# Patient Record
Sex: Female | Born: 1955 | Race: White | Hispanic: No | Smoking: Never smoker
Health system: Southern US, Community
[De-identification: ages and names within clinical notes are randomized; demographics above are authoritative.]

## PROBLEM LIST (undated history)

## (undated) DIAGNOSIS — G510 Bell's palsy: Secondary | ICD-10-CM

## (undated) DIAGNOSIS — G43909 Migraine, unspecified, not intractable, without status migrainosus: Secondary | ICD-10-CM

## (undated) DIAGNOSIS — M25552 Pain in left hip: Secondary | ICD-10-CM

## (undated) DIAGNOSIS — E039 Hypothyroidism, unspecified: Secondary | ICD-10-CM

## (undated) DIAGNOSIS — I10 Essential (primary) hypertension: Secondary | ICD-10-CM

## (undated) DIAGNOSIS — K760 Fatty (change of) liver, not elsewhere classified: Secondary | ICD-10-CM

## (undated) DIAGNOSIS — E785 Hyperlipidemia, unspecified: Secondary | ICD-10-CM

## (undated) DIAGNOSIS — H409 Unspecified glaucoma: Secondary | ICD-10-CM

## (undated) DIAGNOSIS — M545 Low back pain, unspecified: Secondary | ICD-10-CM

## (undated) DIAGNOSIS — E559 Vitamin D deficiency, unspecified: Secondary | ICD-10-CM

## (undated) HISTORY — DX: Unspecified glaucoma: H40.9

## (undated) HISTORY — DX: Pain in left hip: M25.552

## (undated) HISTORY — DX: Bell's palsy: G51.0

## (undated) HISTORY — DX: Hyperlipidemia, unspecified: E78.5

## (undated) HISTORY — DX: Vitamin D deficiency, unspecified: E55.9

## (undated) HISTORY — DX: Low back pain, unspecified: M54.50

## (undated) HISTORY — DX: Hypothyroidism, unspecified: E03.9

## (undated) HISTORY — DX: Low back pain: M54.5

## (undated) HISTORY — DX: Fatty (change of) liver, not elsewhere classified: K76.0

## (undated) HISTORY — DX: Essential (primary) hypertension: I10

## (undated) HISTORY — DX: Migraine, unspecified, not intractable, without status migrainosus: G43.909

---

## 2002-01-20 ENCOUNTER — Other Ambulatory Visit: Admission: RE | Admit: 2002-01-20 | Discharge: 2002-01-20 | Payer: Self-pay | Admitting: Obstetrics and Gynecology

## 2004-11-06 ENCOUNTER — Encounter: Admission: RE | Admit: 2004-11-06 | Discharge: 2004-11-06 | Payer: Self-pay | Admitting: Internal Medicine

## 2005-11-12 ENCOUNTER — Encounter: Admission: RE | Admit: 2005-11-12 | Discharge: 2005-11-12 | Payer: Self-pay | Admitting: Internal Medicine

## 2006-12-29 ENCOUNTER — Encounter: Admission: RE | Admit: 2006-12-29 | Discharge: 2006-12-29 | Payer: Self-pay | Admitting: Internal Medicine

## 2007-01-05 ENCOUNTER — Encounter: Admission: RE | Admit: 2007-01-05 | Discharge: 2007-01-05 | Payer: Self-pay | Admitting: Internal Medicine

## 2007-08-24 ENCOUNTER — Other Ambulatory Visit: Admission: RE | Admit: 2007-08-24 | Discharge: 2007-08-24 | Payer: Self-pay | Admitting: Obstetrics and Gynecology

## 2007-12-30 ENCOUNTER — Encounter: Admission: RE | Admit: 2007-12-30 | Discharge: 2007-12-30 | Payer: Self-pay | Admitting: Internal Medicine

## 2008-02-11 ENCOUNTER — Emergency Department (HOSPITAL_COMMUNITY): Admission: EM | Admit: 2008-02-11 | Discharge: 2008-02-11 | Payer: Self-pay | Admitting: Emergency Medicine

## 2008-02-13 ENCOUNTER — Emergency Department (HOSPITAL_COMMUNITY): Admission: EM | Admit: 2008-02-13 | Discharge: 2008-02-13 | Payer: Self-pay | Admitting: Emergency Medicine

## 2008-12-31 ENCOUNTER — Encounter: Admission: RE | Admit: 2008-12-31 | Discharge: 2008-12-31 | Payer: Self-pay | Admitting: Internal Medicine

## 2010-01-02 ENCOUNTER — Encounter: Admission: RE | Admit: 2010-01-02 | Discharge: 2010-01-02 | Payer: Self-pay | Admitting: Internal Medicine

## 2010-08-31 ENCOUNTER — Encounter: Payer: Self-pay | Admitting: Internal Medicine

## 2010-12-22 ENCOUNTER — Other Ambulatory Visit: Payer: Self-pay | Admitting: Internal Medicine

## 2010-12-22 DIAGNOSIS — Z1231 Encounter for screening mammogram for malignant neoplasm of breast: Secondary | ICD-10-CM

## 2011-01-06 ENCOUNTER — Ambulatory Visit
Admission: RE | Admit: 2011-01-06 | Discharge: 2011-01-06 | Disposition: A | Discharge status: Home / Self Care | Payer: 59 | Source: Ambulatory Visit | Attending: Internal Medicine | Admitting: Internal Medicine

## 2011-01-06 DIAGNOSIS — Z1231 Encounter for screening mammogram for malignant neoplasm of breast: Secondary | ICD-10-CM

## 2011-05-07 LAB — BASIC METABOLIC PANEL
BUN: 9
CO2: 28
Calcium: 9.3
Chloride: 103
Creatinine, Ser: 0.93
GFR calc Af Amer: >60
GFR calc non Af Amer: >60
Glucose, Bld: 116 — ABNORMAL HIGH
Potassium: 3.7
Sodium: 138

## 2011-05-07 LAB — DIFFERENTIAL
Basophils Absolute: 0.1
Basophils Relative: 1
Eosinophils Absolute: 0
Eosinophils Relative: 0
Lymphocytes Relative: 10 — ABNORMAL LOW
Lymphs Abs: 1.3
Monocytes Absolute: 0.5
Monocytes Relative: 4
Neutro Abs: 11.6 — ABNORMAL HIGH
Neutrophils Relative %: 86 — ABNORMAL HIGH

## 2011-05-07 LAB — URINE MICROSCOPIC-ADD ON

## 2011-05-07 LAB — URINALYSIS, ROUTINE W REFLEX MICROSCOPIC
Bilirubin Urine: NEGATIVE
Bilirubin Urine: NEGATIVE
Glucose, UA: NEGATIVE
Glucose, UA: NEGATIVE
Ketones, ur: NEGATIVE
Ketones, ur: NEGATIVE
Leukocytes, UA: NEGATIVE
Nitrite: NEGATIVE
Nitrite: NEGATIVE
Protein, ur: NEGATIVE
Protein, ur: NEGATIVE
Specific Gravity, Urine: 1.014
Specific Gravity, Urine: 1.022
Urobilinogen, UA: 0.2
Urobilinogen, UA: 0.2
pH: 6
pH: 6

## 2011-05-07 LAB — CBC
HCT: 41.3
Hemoglobin: 14.2
MCHC: 34.3
MCV: 89.2
Platelets: 350
RBC: 4.63
RDW: 13.6
WBC: 13.5 — ABNORMAL HIGH

## 2011-08-18 ENCOUNTER — Other Ambulatory Visit: Payer: Self-pay | Admitting: Internal Medicine

## 2011-08-18 DIAGNOSIS — R7989 Other specified abnormal findings of blood chemistry: Secondary | ICD-10-CM

## 2011-08-18 DIAGNOSIS — R945 Abnormal results of liver function studies: Secondary | ICD-10-CM

## 2011-08-26 ENCOUNTER — Ambulatory Visit
Admission: RE | Admit: 2011-08-26 | Discharge: 2011-08-26 | Disposition: A | Discharge status: Home / Self Care | Payer: 59 | Source: Ambulatory Visit | Attending: Internal Medicine | Admitting: Internal Medicine

## 2011-08-26 DIAGNOSIS — R7989 Other specified abnormal findings of blood chemistry: Secondary | ICD-10-CM

## 2011-08-26 DIAGNOSIS — R945 Abnormal results of liver function studies: Secondary | ICD-10-CM

## 2011-11-17 ENCOUNTER — Other Ambulatory Visit (HOSPITAL_COMMUNITY)
Admission: RE | Admit: 2011-11-17 | Discharge: 2011-11-17 | Disposition: A | Discharge status: Home / Self Care | Payer: 59 | Source: Ambulatory Visit | Attending: Internal Medicine | Admitting: Internal Medicine

## 2011-11-17 DIAGNOSIS — Z01419 Encounter for gynecological examination (general) (routine) without abnormal findings: Secondary | ICD-10-CM | POA: Insufficient documentation

## 2011-12-03 ENCOUNTER — Other Ambulatory Visit: Payer: Self-pay | Admitting: Internal Medicine

## 2011-12-03 DIAGNOSIS — Z1231 Encounter for screening mammogram for malignant neoplasm of breast: Secondary | ICD-10-CM

## 2012-01-07 ENCOUNTER — Ambulatory Visit: Payer: 59

## 2012-01-14 ENCOUNTER — Ambulatory Visit
Admission: RE | Admit: 2012-01-14 | Discharge: 2012-01-14 | Disposition: A | Discharge status: Home / Self Care | Payer: 59 | Source: Ambulatory Visit | Attending: Internal Medicine | Admitting: Internal Medicine

## 2012-01-14 DIAGNOSIS — Z1231 Encounter for screening mammogram for malignant neoplasm of breast: Secondary | ICD-10-CM

## 2012-10-06 ENCOUNTER — Other Ambulatory Visit: Payer: Self-pay | Admitting: Internal Medicine

## 2012-10-06 DIAGNOSIS — R109 Unspecified abdominal pain: Secondary | ICD-10-CM

## 2012-10-11 ENCOUNTER — Ambulatory Visit
Admission: RE | Admit: 2012-10-11 | Discharge: 2012-10-11 | Disposition: A | Discharge status: Home / Self Care | Payer: 59 | Source: Ambulatory Visit | Attending: Internal Medicine | Admitting: Internal Medicine

## 2012-10-11 DIAGNOSIS — R109 Unspecified abdominal pain: Secondary | ICD-10-CM

## 2012-10-11 MED ORDER — IOHEXOL 300 MG/ML  SOLN
100.0000 mL | Freq: Once | INTRAMUSCULAR | Status: AC | PRN
  Administered 2012-10-11: 100 mL via INTRAVENOUS

## 2013-02-20 ENCOUNTER — Other Ambulatory Visit: Payer: Self-pay

## 2013-02-20 DIAGNOSIS — Z1231 Encounter for screening mammogram for malignant neoplasm of breast: Secondary | ICD-10-CM

## 2013-03-17 ENCOUNTER — Ambulatory Visit
Admission: RE | Admit: 2013-03-17 | Discharge: 2013-03-17 | Disposition: A | Discharge status: Home / Self Care | Payer: 59 | Source: Ambulatory Visit

## 2013-03-17 DIAGNOSIS — Z1231 Encounter for screening mammogram for malignant neoplasm of breast: Secondary | ICD-10-CM

## 2014-04-26 ENCOUNTER — Other Ambulatory Visit: Payer: Self-pay | Admitting: Internal Medicine

## 2014-04-26 DIAGNOSIS — N6451 Induration of breast: Secondary | ICD-10-CM

## 2015-04-01 ENCOUNTER — Other Ambulatory Visit: Payer: Self-pay

## 2015-04-01 DIAGNOSIS — Z1231 Encounter for screening mammogram for malignant neoplasm of breast: Secondary | ICD-10-CM

## 2015-05-01 ENCOUNTER — Other Ambulatory Visit: Payer: Self-pay | Admitting: Internal Medicine

## 2015-05-01 ENCOUNTER — Ambulatory Visit
Admission: RE | Admit: 2015-05-01 | Discharge: 2015-05-01 | Disposition: A | Discharge status: Home / Self Care | Payer: 59 | Source: Ambulatory Visit

## 2015-05-01 DIAGNOSIS — N63 Unspecified lump in unspecified breast: Secondary | ICD-10-CM

## 2015-05-01 DIAGNOSIS — N644 Mastodynia: Secondary | ICD-10-CM

## 2015-05-01 DIAGNOSIS — Z1231 Encounter for screening mammogram for malignant neoplasm of breast: Secondary | ICD-10-CM

## 2015-05-03 ENCOUNTER — Other Ambulatory Visit: Payer: Self-pay | Admitting: Internal Medicine

## 2015-05-03 DIAGNOSIS — R945 Abnormal results of liver function studies: Secondary | ICD-10-CM

## 2015-05-09 ENCOUNTER — Ambulatory Visit
Admission: RE | Admit: 2015-05-09 | Discharge: 2015-05-09 | Disposition: A | Discharge status: Home / Self Care | Payer: 59 | Source: Ambulatory Visit | Attending: Internal Medicine | Admitting: Internal Medicine

## 2015-05-09 DIAGNOSIS — R945 Abnormal results of liver function studies: Secondary | ICD-10-CM

## 2015-05-14 ENCOUNTER — Ambulatory Visit
Admission: RE | Admit: 2015-05-14 | Discharge: 2015-05-14 | Disposition: A | Discharge status: Home / Self Care | Payer: 59 | Source: Ambulatory Visit | Attending: Internal Medicine | Admitting: Internal Medicine

## 2015-05-14 DIAGNOSIS — N644 Mastodynia: Secondary | ICD-10-CM

## 2015-05-14 DIAGNOSIS — N63 Unspecified lump in unspecified breast: Secondary | ICD-10-CM

## 2017-01-05 ENCOUNTER — Other Ambulatory Visit: Payer: Self-pay | Admitting: Internal Medicine

## 2017-01-05 DIAGNOSIS — Z1231 Encounter for screening mammogram for malignant neoplasm of breast: Secondary | ICD-10-CM

## 2017-01-15 ENCOUNTER — Ambulatory Visit
Admission: RE | Admit: 2017-01-15 | Discharge: 2017-01-15 | Disposition: A | Discharge status: Home / Self Care | Payer: Commercial Managed Care - HMO | Source: Ambulatory Visit | Attending: Internal Medicine | Admitting: Internal Medicine

## 2017-01-15 DIAGNOSIS — Z1231 Encounter for screening mammogram for malignant neoplasm of breast: Secondary | ICD-10-CM

## 2017-01-19 ENCOUNTER — Other Ambulatory Visit: Payer: Self-pay | Admitting: Internal Medicine

## 2017-01-19 DIAGNOSIS — R928 Other abnormal and inconclusive findings on diagnostic imaging of breast: Secondary | ICD-10-CM

## 2017-01-21 ENCOUNTER — Other Ambulatory Visit: Payer: Commercial Managed Care - HMO

## 2017-01-28 ENCOUNTER — Ambulatory Visit
Admission: RE | Admit: 2017-01-28 | Discharge: 2017-01-28 | Disposition: A | Discharge status: Home / Self Care | Payer: Commercial Managed Care - HMO | Source: Ambulatory Visit | Attending: Internal Medicine | Admitting: Internal Medicine

## 2017-01-28 ENCOUNTER — Other Ambulatory Visit: Payer: Self-pay | Admitting: Internal Medicine

## 2017-01-28 DIAGNOSIS — N6321 Unspecified lump in the left breast, upper outer quadrant: Secondary | ICD-10-CM | POA: Diagnosis not present

## 2017-01-28 DIAGNOSIS — R928 Other abnormal and inconclusive findings on diagnostic imaging of breast: Secondary | ICD-10-CM

## 2017-01-28 DIAGNOSIS — N6320 Unspecified lump in the left breast, unspecified quadrant: Secondary | ICD-10-CM

## 2017-01-29 ENCOUNTER — Ambulatory Visit
Admission: RE | Admit: 2017-01-29 | Discharge: 2017-01-29 | Disposition: A | Discharge status: Home / Self Care | Payer: Commercial Managed Care - HMO | Source: Ambulatory Visit | Attending: Internal Medicine | Admitting: Internal Medicine

## 2017-01-29 ENCOUNTER — Other Ambulatory Visit: Payer: Self-pay | Admitting: Internal Medicine

## 2017-01-29 DIAGNOSIS — N6321 Unspecified lump in the left breast, upper outer quadrant: Secondary | ICD-10-CM | POA: Diagnosis not present

## 2017-01-29 DIAGNOSIS — N6032 Fibrosclerosis of left breast: Secondary | ICD-10-CM | POA: Diagnosis not present

## 2017-01-29 DIAGNOSIS — N6320 Unspecified lump in the left breast, unspecified quadrant: Secondary | ICD-10-CM

## 2017-10-26 ENCOUNTER — Telehealth: Payer: Self-pay

## 2017-10-26 NOTE — Telephone Encounter (Signed)
Attached notes to file

## 2017-10-29 ENCOUNTER — Other Ambulatory Visit: Payer: Self-pay | Admitting: Internal Medicine

## 2017-10-29 DIAGNOSIS — N6320 Unspecified lump in the left breast, unspecified quadrant: Secondary | ICD-10-CM

## 2017-11-02 ENCOUNTER — Ambulatory Visit: Payer: 59 | Admitting: Interventional Cardiology

## 2017-11-02 ENCOUNTER — Encounter: Payer: Self-pay | Admitting: Interventional Cardiology

## 2017-11-02 VITALS — BP 164/98 | HR 97 | Ht 64.0 in | Wt 169.8 lb

## 2017-11-02 DIAGNOSIS — I10 Essential (primary) hypertension: Secondary | ICD-10-CM

## 2017-11-02 DIAGNOSIS — R072 Precordial pain: Secondary | ICD-10-CM

## 2017-11-02 DIAGNOSIS — E782 Mixed hyperlipidemia: Secondary | ICD-10-CM | POA: Diagnosis not present

## 2017-11-02 MED ORDER — LISINOPRIL 10 MG PO TABS: 10.0000 mg | ORAL_TABLET | Freq: Every day | ORAL | 3 refills | Status: DC

## 2017-11-02 NOTE — Patient Instructions (Addendum)
Medication Instructions:  Your physician has recommended you make the following change in your medication:   START: lisinopril 10 mg daily  Labwork: Your physician recommends that you return for lab work in: 1 week for BMET   Follow-Up: Your physician recommends that you schedule a follow-up appointment in 1-2 weeks in the Hypertension Clinic for Blood Pressure Management.  Testing/Procedures: Once your Blood Pressure is controlled then we will schedule you for an exercise treadmill test.       Any Other Special Instructions Will Be Listed Below (If Applicable).   Heart-Healthy Eating Plan Heart-healthy meal planning includes:  Limiting unhealthy fats.  Increasing healthy fats.  Making other small dietary changes.  You may need to talk with your doctor or a diet specialist (dietitian) to create an eating plan that is right for you. What types of fat should I choose?  Choose healthy fats. These include olive oil and canola oil, flaxseeds, walnuts, almonds, and seeds.  Eat more omega-3 fats. These include salmon, mackerel, sardines, tuna, flaxseed oil, and ground flaxseeds. Try to eat fish at least twice each week.  Limit saturated fats. ? Saturated fats are often found in animal products, such as meats, butter, and cream. ? Plant sources of saturated fats include palm oil, palm kernel oil, and coconut oil.  Avoid foods with partially hydrogenated oils in them. These include stick margarine, some tub margarines, cookies, crackers, and other baked goods. These contain trans fats. What general guidelines do I need to follow?  Check food labels carefully. Identify foods with trans fats or high amounts of saturated fat.  Fill one half of your plate with vegetables and green salads. Eat 4-5 servings of vegetables per day. A serving of vegetables is: ? 1 cup of raw leafy vegetables. ?  cup of raw or cooked cut-up vegetables. ?  cup of vegetable juice.  Fill one fourth of  your plate with whole grains. Look for the word "whole" as the first word in the ingredient list.  Fill one fourth of your plate with lean protein foods.  Eat 4-5 servings of fruit per day. A serving of fruit is: ? One medium whole fruit. ?  cup of dried fruit. ?  cup of fresh, frozen, or canned fruit. ?  cup of 100% fruit juice.  Eat more foods that contain soluble fiber. These include apples, broccoli, carrots, beans, peas, and barley. Try to get 20-30 g of fiber per day.  Eat more home-cooked food. Eat less restaurant, buffet, and fast food.  Limit or avoid alcohol.  Limit foods high in starch and sugar.  Avoid fried foods.  Avoid frying your food. Try baking, boiling, grilling, or broiling it instead. You can also reduce fat by: ? Removing the skin from poultry. ? Removing all visible fats from meats. ? Skimming the fat off of stews, soups, and gravies before serving them. ? Steaming vegetables in water or broth.  Lose weight if you are overweight.  Eat 4-5 servings of nuts, legumes, and seeds per week: ? One serving of dried beans or legumes equals  cup after being cooked. ? One serving of nuts equals 1 ounces. ? One serving of seeds equals  ounce or one tablespoon.  You may need to keep track of how much salt or sodium you eat. This is especially true if you have high blood pressure. Talk with your doctor or dietitian to get more information. What foods can I eat? Grains Breads, including [place], white, pita, wheat,  raisin, rye, oatmeal, and [place]. Tortillas that are neither fried nor made with lard or trans fat. Low-fat rolls, including hotdog and hamburger buns and English muffins. Biscuits. Muffins. Waffles. Pancakes. Light popcorn. Whole-grain cereals. Flatbread. Melba toast. Pretzels. Breadsticks. Rusks. Low-fat snacks. Low-fat crackers, including oyster, saltine, matzo, graham, animal, and rye. Rice and pasta, including brown rice and pastas that are made with  whole wheat. Vegetables All vegetables. Fruits All fruits, but limit coconut. Meats and Other Protein Sources Lean, well-trimmed beef, veal, pork, and lamb. Chicken and [place] without skin. All fish and shellfish. Wild duck, rabbit, pheasant, and venison. Egg whites or low-cholesterol egg substitutes. Dried beans, peas, lentils, and tofu. Seeds and most nuts. Dairy Low-fat or nonfat cheeses, including ricotta, string, and mozzarella. Skim or 1% milk that is liquid, powdered, or evaporated. Buttermilk that is made with low-fat milk. Nonfat or low-fat yogurt. Beverages Mineral water. Diet carbonated beverages. Sweets and Desserts Sherbets and fruit ices. Honey, jam, marmalade, jelly, and syrups. Meringues and gelatins. Pure sugar candy, such as hard candy, jelly beans, gumdrops, mints, marshmallows, and small amounts of dark chocolate. [employer]. Eat all sweets and desserts in moderation. Fats and Oils Nonhydrogenated (trans-free) margarines. Vegetable oils, including soybean, sesame, sunflower, olive, peanut, safflower, corn, canola, and cottonseed. Salad dressings or mayonnaise made with a vegetable oil. Limit added fats and oils that you use for cooking, baking, salads, and as spreads. Other Cocoa powder. Coffee and tea. All seasonings and condiments. The items listed above may not be a complete list of recommended foods or beverages. Contact your dietitian for more options. What foods are not recommended? Grains Breads that are made with saturated or trans fats, oils, or whole milk. Croissants. Butter rolls. Cheese breads. Sweet rolls. Donuts. Buttered popcorn. Chow mein noodles. High-fat crackers, such as cheese or butter crackers. Meats and Other Protein Sources Fatty meats, such as hotdogs, short ribs, sausage, spareribs, bacon, rib eye roast or steak, and mutton. High-fat deli meats, such as salami and bologna. Caviar. Domestic duck and goose. Organ meats, such as kidney, liver,  sweetbreads, and heart. Dairy Cream, sour cream, cream cheese, and creamed cottage cheese. Whole-milk cheeses, including blue (bleu), [address], [place], [place], [address], [place], [address], cheddar, [place], and [place]. Whole or 2% milk that is liquid, evaporated, or condensed. Whole buttermilk. Cream sauce or high-fat cheese sauce. Yogurt that is made from whole milk. Beverages Regular sodas and juice drinks with added sugar. Sweets and Desserts Frosting. Pudding. Cookies. Cakes other than angel food cake. Candy that has milk chocolate or white chocolate, hydrogenated fat, butter, coconut, or unknown ingredients. Buttered syrups. Full-fat ice cream or ice cream drinks. Fats and Oils Gravy that has suet, meat fat, or shortening. Cocoa butter, hydrogenated oils, palm oil, coconut oil, palm kernel oil. These can often be found in baked products, candy, fried foods, nondairy creamers, and whipped toppings. Solid fats and shortenings, including bacon fat, salt pork, lard, and butter. Nondairy cream substitutes, such as coffee creamers and sour cream substitutes. Salad dressings that are made of unknown oils, cheese, or sour cream. The items listed above may not be a complete list of foods and beverages to avoid. Contact your dietitian for more information. This information is not intended to replace advice given to you by your health care provider. Make sure you discuss any questions you have with your health care provider. Document Released: 01/26/2012 Document Revised: 01/02/2016 Document Reviewed: 01/18/2014 Elsevier Interactive Patient Education  [employer].  If you need a refill on your cardiac medications before your next appointment, please call your pharmacy.

## 2017-11-02 NOTE — Progress Notes (Signed)
Cardiology Office Note   Date:  11/02/2017   ID:  Sheri CowdenRobin Mansfield, DOB 11/27/1955, MRN [id number]  PCP:  Pearson GrippeKim, James, MD    No chief complaint on file.  Chest pain  Wt Readings from Last 3 Encounters:  11/02/17 169 lb 12.8 oz (77 kg)       History of Present Illness: Sheri Edwards is a 62 y.o. female who is being seen today for the evaluation of chest discomfort at the request of Pearson GrippeKim, James, MD.  She notices that She gets some pressure in her chest when she rushes.  Her BP has been high more consistently at various checks.  She was on lisinopril but then stopped when her BP was better.    She is unsure if she still has lisinopril.  No tobacco abuse.  In her family, no one with stents or CABG.   No recent stress test.    She has had high cholesterol.  She did not tolerate a statin.    Denies : Dizziness. Leg edema. Nitroglycerin use. Orthopnea. Palpitations. Paroxysmal nocturnal dyspnea. Syncope.    Past Medical History:  Diagnosis Date  . Acute left-sided low back pain without sciatica   . Bell's palsy   . Fatty liver    mild  . Glaucoma   . Hip pain, left   . Hyperlipidemia   . Hypertension   . Hypothyroidism   . Migraines   . Vitamin D deficiency     History reviewed. No pertinent surgical history.   Current Outpatient Medications  Medication Sig Dispense Refill  . levothyroxine (SYNTHROID) 112 MCG tablet Take 112 mcg by mouth daily before breakfast.    . Multiple Vitamins-Minerals (MULTIVITAMIN ADULTS PO) Take 1 capsule by mouth daily.    . Vitamin D, Ergocalciferol, (DRISDOL) 50000 units CAPS capsule Take 1 capsule by mouth every 30 (thirty) days.     No current facility-administered medications for this visit.     Allergies:   Codeine sulfate; Flagyl [metronidazole]; Paxil [paroxetine]; Penicillins; Valium [diazepam]; and Barbiturates     Social History:  The patient  reports that she has never smoked. She has never used smokeless tobacco. She  reports that she drinks alcohol.   Family History:  The patient's family history includes Breast cancer in her maternal aunt and sister.    ROS:  Please see the history of present illness.   Otherwise, review of systems are positive for chest pain.   All other systems are reviewed and negative.    PHYSICAL EXAM: VS:  BP (!) 164/98 (BP Location: Right Arm, Patient Position: Sitting, Cuff Size: Normal)   Pulse 97   Ht 5\' 4"  (1.626 m)   Wt 169 lb 12.8 oz (77 kg)   SpO2 97%   BMI 29.15 kg/m  , BMI Body mass index is 29.15 kg/m. GEN: Well nourished, well developed, in no acute distress  HEENT: normal  Neck: no JVD, carotid bruits, or masses Cardiac: RRR; no murmurs, rubs, or gallops,no edema ; 2+ DP pulses Respiratory:  clear to auscultation bilaterally, normal work of breathing GI: soft, nontender, nondistended, + BS MS: no deformity or atrophy  Skin: warm and dry, no rash Neuro:  Strength and sensation are intact Psych: euthymic mood, full affect   EKG:   The ekg ordered today demonstrates NSR, no ST chnages   Recent Labs: No results found for requested labs within last 8760 hours.   Lipid Panel No results found for: CHOL, TRIG, HDL, CHOLHDL, VLDL,  LDLCALC, LDLDIRECT   Other studies Reviewed: Additional studies/ records that were reviewed today with results demonstrating: 4/17 LDL 125.   ASSESSMENT AND PLAN:  1. Chest pain: After BP controlled.  Plan for ETT once BP is controlled.  Some of her sx may be from HTN.  If BP is elevated, may need to delay ETT further and increase lisinopril. 2. HTN: Start lisinopril 10 mg daily. Check BMet in a week.  HTN clinic visit.   3. Hyperlipidemia:  Labs to be checked in May.  Will give heart healthy diet info.  She may need to try a different statin.  She is unsure of which statin she did not tolerate in the past.    Current medicines are reviewed at length with the patient today.  The patient concerns regarding her medicines were  addressed.  The following changes have been made:  Start lisinopril; check BMet  Labs/ tests ordered today include:  No orders of the defined types were placed in this encounter.   Recommend 150 minutes/week of aerobic exercise Low fat, low carb, high fiber diet recommended  Disposition:   FU in HTN clinic   Signed, Lance Muss, MD  11/02/2017 2:31 PM    [facility] [address] [place], [place], [place]  [address] Phone: [phone]; Fax: [phone]

## 2017-11-05 ENCOUNTER — Ambulatory Visit: Payer: 59

## 2017-11-05 ENCOUNTER — Ambulatory Visit
Admission: RE | Admit: 2017-11-05 | Discharge: 2017-11-05 | Disposition: A | Discharge status: Home / Self Care | Payer: 59 | Source: Ambulatory Visit | Attending: Internal Medicine | Admitting: Internal Medicine

## 2017-11-05 DIAGNOSIS — R922 Inconclusive mammogram: Secondary | ICD-10-CM | POA: Diagnosis not present

## 2017-11-05 DIAGNOSIS — N6320 Unspecified lump in the left breast, unspecified quadrant: Secondary | ICD-10-CM

## 2017-11-11 ENCOUNTER — Ambulatory Visit: Payer: 59

## 2017-11-12 ENCOUNTER — Other Ambulatory Visit: Payer: 59 | Admitting: *Deleted

## 2017-11-12 DIAGNOSIS — I10 Essential (primary) hypertension: Secondary | ICD-10-CM | POA: Diagnosis not present

## 2017-11-12 DIAGNOSIS — R072 Precordial pain: Secondary | ICD-10-CM

## 2017-11-13 LAB — BASIC METABOLIC PANEL
BUN/Creatinine Ratio: 16 (ref 12–28)
BUN: 15 mg/dL (ref 8–27)
CO2: 21 mmol/L (ref 20–29)
Calcium: 9.9 mg/dL (ref 8.7–10.3)
Chloride: 103 mmol/L (ref 96–106)
Creatinine, Ser: 0.91 mg/dL (ref 0.57–1.00)
GFR calc Af Amer: 79 mL/min/{1.73_m2} (ref >59)
GFR calc non Af Amer: 68 mL/min/{1.73_m2} (ref >59)
Glucose: 96 mg/dL (ref 65–99)
Potassium: 4.3 mmol/L (ref 3.5–5.2)
Sodium: 141 mmol/L (ref 134–144)

## 2017-11-17 ENCOUNTER — Ambulatory Visit (INDEPENDENT_AMBULATORY_CARE_PROVIDER_SITE_OTHER): Payer: 59 | Admitting: Pharmacist Clinician (PhC)/ Clinical Pharmacy Specialist

## 2017-11-17 ENCOUNTER — Encounter: Payer: Self-pay | Admitting: Pharmacist Clinician (PhC)/ Clinical Pharmacy Specialist

## 2017-11-17 DIAGNOSIS — I10 Essential (primary) hypertension: Secondary | ICD-10-CM

## 2017-11-17 MED ORDER — LISINOPRIL 20 MG PO TABS: 20.0000 mg | ORAL_TABLET | Freq: Every day | ORAL | 3 refills | Status: DC

## 2017-11-17 MED ORDER — BLOOD PRESSURE MONITOR AUTOMAT DEVI: 0 refills | Status: AC

## 2017-11-17 NOTE — Progress Notes (Signed)
11/17/2017 Sheri Edwards [birth year] [id number]   HPI:  Sheri Edwards is a 62 y.o. female patient of Dr Eldridge Dace, with a PMH below who presents today for hypertension clinic evaluation.  She was referred to Dr. Eldridge Dace for chest pain.  She notices it when she rushes around or is in a hurry.  She describes it as a tight feeling with some associated shortness of breath.  She has no other significant medical history.    Patient reports her blood pressure has been elevated at times over the past several years.  She states that if she modified her behavior it would go back down, but recently that has not been working for her.  Her behavior modifications consisted mainly of cutting back on salt.  She has no complaints of lower extremity edema, but she does complain of dizziness about 2 hours after taking her lisinopril every morning.  She also notes occasional chest pain, but admits to having GERD and believes it related to that.  Blood Pressure Goal:  130/80  Current Medications:  Lisinopril 10 mg qd - am  Family Hx:  Mother 01-10-1987) - hypertension, died after fall and head injury  Father (56) - kidney failure/diabetes  1 sister w/o any heart issues   Social Hx:  No tobacco, quit several times, last cigarette about 10 years ago; 1-2 glasses of wine per night; coffee in am, diet soda and tea during days Diet:  Sugars running high 100-120 on physicals; mostly home cooked meals, admits to excessive salt intake; trying to avoid adding salt at table; some fruits/vegetables, salads, eats a lot of Mediterranean foods; more berries than fruits  Exercise:  None recently, some stretching with [employer]retch on [employer]   Home BP readings:  No home cuff  Intolerances:   Aspirin, no antihypertensive medications  Labs:    11/12/17:  Na 141, K 4.3, Glu 96, BUN 15, SCr 0.91  CrCl (with weight of 77 kg) 78.9  Wt Readings from Last 3 Encounters:  11/02/17 169 lb 12.8 oz (77 kg)   BP Readings from Last 3  Encounters:  11/17/17 (!) 158/84  11/02/17 (!) 164/98   Pulse Readings from Last 3 Encounters:  11/17/17 80  11/02/17 97    Current Outpatient Medications  Medication Sig Dispense Refill  . Blood Pressure Monitoring (BLOOD PRESSURE MONITOR AUTOMAT) DEVI Check home blood pressures daily 1 Device 0  . levothyroxine (SYNTHROID) 112 MCG tablet Take 112 mcg by mouth daily before breakfast.    . lisinopril (PRINIVIL,ZESTRIL) 20 MG tablet Take 1 tablet (20 mg total) by mouth daily. 30 tablet 3  . Multiple Vitamins-Minerals (MULTIVITAMIN ADULTS PO) Take 1 capsule by mouth daily.    . Vitamin D, Ergocalciferol, (DRISDOL) 50000 units CAPS capsule Take 1 capsule by mouth every 30 (thirty) days.     No current facility-administered medications for this visit.     Allergies  Allergen Reactions  . Aspirin Swelling  . Barbiturates Hives  . Valium [Diazepam] Hives  . Flagyl [Metronidazole] Other (See Comments)    Pt unsure about reactions to this medication.  Marland Kitchen Penicillins Other (See Comments)    Pt unsure about reactions  . Codeine Sulfate Other (See Comments)    Pt states "the lights turn yellow and  feels not normal". It also makes pt feel sick.  . Paxil [Paroxetine] Other (See Comments)    Pt states "it makes me have thoughts of killing myself"    Past Medical History:  Diagnosis  Date  . Acute left-sided low back pain without sciatica   . Bell's palsy   . Fatty liver    mild  . Glaucoma   . Hip pain, left   . Hyperlipidemia   . Hypertension   . Hypothyroidism   . Migraines   . Vitamin D deficiency     Blood pressure (!) 158/84, pulse 80.  Essential hypertension Patient with essential hypertension, not yet controlled with lisinopril 10 mg.  Will have patient increase dose to 20 mg daily, but move it to evenings to avoid post-dose dizziness.  She is wanting to purchase a home wrist BP cuff, I recommended that she keep the receipt and if the cuff does not read accurately at her  next OV she can return it.  She was encouraged to cut back on her salt intake.  We will see her again in 3 weeks for follow up.  She wishes to go back [facility] [address] office, as that is easier for her to get to.     Phillips HayKristin Stavros Cail PharmD CPP [facility] [address] [place], [place] [address] [phone]

## 2017-11-17 NOTE — Assessment & Plan Note (Signed)
Patient with essential hypertension, not yet controlled with lisinopril 10 mg.  Will have patient increase dose to 20 mg daily, but move it to evenings to avoid post-dose dizziness.  She is wanting to purchase a home wrist BP cuff, I recommended that she keep the receipt and if the cuff does not read accurately at her next OV she can return it.  She was encouraged to cut back on her salt intake.  We will see her again in 3 weeks for follow up.  She wishes to go back to the [address] office, as that is easier for her to get to.

## 2017-11-17 NOTE — Patient Instructions (Signed)
Return for a a follow up appointment in 3 weeks  Your blood pressure today is 158/84  Check your blood pressure at home daily (if able) and keep record of the readings.  Take your BP meds as follows:  Take 10 mg of lisinopril tonight, then increase to 20 mg each night.  Bring all of your meds, your BP cuff and your record of home blood pressures to your next appointment.  Exercise as you're able, try to walk approximately 30 minutes per day.  Keep salt intake to a minimum, especially watch canned and prepared boxed foods.  Eat more fresh fruits and vegetables and fewer canned items.  Avoid eating in fast food restaurants.    HOW TO TAKE YOUR BLOOD PRESSURE: . Rest 5 minutes before taking your blood pressure. .  Don't smoke or drink caffeinated beverages for at least 30 minutes before. . Take your blood pressure before (not after) you eat. . Sit comfortably with your back supported and both feet on the floor (don't cross your legs). . Elevate your arm to heart level on a table or a desk. . Use the proper sized cuff. It should fit smoothly and snugly around your bare upper arm. There should be enough room to slip a fingertip under the cuff. The bottom edge of the cuff should be 1 inch above the crease of the elbow. . Ideally, take 3 measurements at one sitting and record the average.

## 2017-12-13 ENCOUNTER — Ambulatory Visit: Payer: 59

## 2017-12-23 DIAGNOSIS — I10 Essential (primary) hypertension: Secondary | ICD-10-CM | POA: Diagnosis not present

## 2017-12-23 DIAGNOSIS — E7800 Pure hypercholesterolemia, unspecified: Secondary | ICD-10-CM | POA: Diagnosis not present

## 2017-12-23 DIAGNOSIS — E039 Hypothyroidism, unspecified: Secondary | ICD-10-CM | POA: Diagnosis not present

## 2017-12-27 ENCOUNTER — Ambulatory Visit (INDEPENDENT_AMBULATORY_CARE_PROVIDER_SITE_OTHER): Payer: 59 | Admitting: Pharmacist

## 2017-12-27 ENCOUNTER — Encounter: Payer: Self-pay | Admitting: Pharmacist

## 2017-12-27 VITALS — BP 136/88 | HR 77

## 2017-12-27 DIAGNOSIS — I10 Essential (primary) hypertension: Secondary | ICD-10-CM | POA: Diagnosis not present

## 2017-12-27 MED ORDER — LISINOPRIL 40 MG PO TABS: 40.0000 mg | ORAL_TABLET | Freq: Every day | ORAL | 3 refills | Status: DC

## 2017-12-27 NOTE — Patient Instructions (Addendum)
Return for a follow up appointment in 4-6 weeks  Your blood pressure goal is less than 130/80   Check your blood pressure at home daily (if able) and keep record of the readings.  Take your BP meds as follows: INCREASE lisinopril to  ONCE daily (you can take 2 tablets of your current supply ONCE daily until finished then pick up higher strength from pharmacy and take 1 tablet ( ) daily)  Bring all of your meds, your BP cuff and your record of home blood pressures to your next appointment.  Exercise as you're able, try to walk approximately 30 minutes per day.  Keep salt intake to a minimum, especially watch canned and prepared boxed foods.  Eat more fresh fruits and vegetables and fewer canned items.  Avoid eating in fast food restaurants.    HOW TO TAKE YOUR BLOOD PRESSURE: . Rest 5 minutes before taking your blood pressure. .  Don't smoke or drink caffeinated beverages for at least 30 minutes before. . Take your blood pressure before (not after) you eat. . Sit comfortably with your back supported and both feet on the floor (don't cross your legs). . Elevate your arm to heart level on a table or a desk. . Use the proper sized cuff. It should fit smoothly and snugly around your bare upper arm. There should be enough room to slip a fingertip under the cuff. The bottom edge of the cuff should be 1 inch above the crease of the elbow. . Ideally, take 3 measurements at one sitting and record the average.

## 2017-12-27 NOTE — Progress Notes (Signed)
Patient ID: Sheri Edwards                 DOB: 05/15/1956                      MRN: [id number]     HPI: Sheri Edwards is a 62 y.o. female patient of Dr. Eldridge Dace who presents today for hypertension follow up. PMH significant for HTN. She was referred for to cardiology for chest pain work up. At her most recent visit in HTN clinic her lisinopril was increased to  daily.   She presents today for follow up. She had dizziness a little bit in the beginning, but this has improved. She rarely will miss about once per month. She reports that her chest pain seems better. She rarely gets headaches, but she believes that most of these are related to arthritis and ocular headaches.   Current HTN meds:  Lisinopril  daily  Previously tried: none  BP goal: <130/80  Family History: mother (80) - HTH, died after fall and head injury, father (68) - kidney failure/diabetes, 1 sister with heart issues  Social History: No tobacco, quit several times, last cigarette about 10 years ago; 1-2 glasses of wine per night; coffee in am, diet soda and tea during days  Diet: Sugars running high 100-120 on physicals; mostly home cooked meals, admits to excessive salt intake; trying to avoid adding salt at table; some fruits/vegetables, salads, eats a lot of Mediterranean foods; more berries than fruits  Exercise:  None recently, some stretching with [employer] on [employer]  Home BP readings: 120s-130s/70s at home   Wt Readings from Last 3 Encounters:  11/02/17 169 lb 12.8 oz (77 kg)   BP Readings from Last 3 Encounters:  12/27/17 136/88  11/17/17 (!) 158/84  11/02/17 (!) 164/98   Pulse Readings from Last 3 Encounters:  12/27/17 77  11/17/17 80  11/02/17 97    Renal function: CrCl cannot be calculated (Unknown ideal weight.).  Past Medical History:  Diagnosis Date  . Acute left-sided low back pain without sciatica   . Bell's palsy   . Fatty liver    mild  . Glaucoma   . Hip pain, left   .  Hyperlipidemia   . Hypertension   . Hypothyroidism   . Migraines   . Vitamin D deficiency     Current Outpatient Medications on File Prior to Visit  Medication Sig Dispense Refill  . levothyroxine (SYNTHROID) 112 MCG tablet Take 112 mcg by mouth daily before breakfast.    . Multiple Vitamins-Minerals (MULTIVITAMIN ADULTS PO) Take 1 capsule by mouth daily.    . Vitamin D, Ergocalciferol, (DRISDOL) 50000 units CAPS capsule Take 1 capsule by mouth every 30 (thirty) days.    . Blood Pressure Monitoring (BLOOD PRESSURE MONITOR AUTOMAT) DEVI Check home blood pressures daily 1 Device 0   No current facility-administered medications on file prior to visit.     Allergies  Allergen Reactions  . Aspirin Swelling  . Barbiturates Hives  . Valium [Diazepam] Hives  . Flagyl [Metronidazole] Other (See Comments)    Pt unsure about reactions to this medication.  Marland Kitchen Penicillins Other (See Comments)    Pt unsure about reactions  . Codeine Sulfate Other (See Comments)    Pt states "the lights turn yellow and  feels not normal". It also makes pt feel sick.  . Paxil [Paroxetine] Other (See Comments)    Pt states "it makes me have thoughts of killing  myself"    Blood pressure 136/88, pulse 77.   Assessment/Plan: Hypertension: BMET today with dose increase in lisinopril. BP today is borderline at goal will increase lisinopril to  daily. Asked that she continue to follow pressures and bring log to follow up appt in 4-6 weeks.    Thank you, Freddie Apley. Cleatis Polka, PharmD  [facility]  12/28/2017 1:13 PM  ADDENDUM: BMET WNL. Continue as above. LMOM.

## 2017-12-28 LAB — BASIC METABOLIC PANEL
BUN/Creatinine Ratio: 18 (ref 12–28)
BUN: 14 mg/dL (ref 8–27)
CO2: 23 mmol/L (ref 20–29)
Calcium: 10 mg/dL (ref 8.7–10.3)
Chloride: 102 mmol/L (ref 96–106)
Creatinine, Ser: 0.76 mg/dL (ref 0.57–1.00)
GFR calc Af Amer: 97 mL/min/{1.73_m2} (ref >59)
GFR calc non Af Amer: 84 mL/min/{1.73_m2} (ref >59)
Glucose: 85 mg/dL (ref 65–99)
Potassium: 4.3 mmol/L (ref 3.5–5.2)
Sodium: 142 mmol/L (ref 134–144)

## 2017-12-30 DIAGNOSIS — K7689 Other specified diseases of liver: Secondary | ICD-10-CM | POA: Diagnosis not present

## 2017-12-30 DIAGNOSIS — I10 Essential (primary) hypertension: Secondary | ICD-10-CM | POA: Diagnosis not present

## 2017-12-30 DIAGNOSIS — E039 Hypothyroidism, unspecified: Secondary | ICD-10-CM | POA: Diagnosis not present

## 2018-01-04 DIAGNOSIS — R293 Abnormal posture: Secondary | ICD-10-CM | POA: Diagnosis not present

## 2018-01-04 DIAGNOSIS — M542 Cervicalgia: Secondary | ICD-10-CM | POA: Diagnosis not present

## 2018-01-04 DIAGNOSIS — M2560 Stiffness of unspecified joint, not elsewhere classified: Secondary | ICD-10-CM | POA: Diagnosis not present

## 2018-01-10 DIAGNOSIS — M542 Cervicalgia: Secondary | ICD-10-CM | POA: Diagnosis not present

## 2018-01-10 DIAGNOSIS — M2560 Stiffness of unspecified joint, not elsewhere classified: Secondary | ICD-10-CM | POA: Diagnosis not present

## 2018-01-10 DIAGNOSIS — R293 Abnormal posture: Secondary | ICD-10-CM | POA: Diagnosis not present

## 2018-01-19 DIAGNOSIS — I10 Essential (primary) hypertension: Secondary | ICD-10-CM | POA: Diagnosis not present

## 2018-01-19 DIAGNOSIS — E039 Hypothyroidism, unspecified: Secondary | ICD-10-CM | POA: Diagnosis not present

## 2018-01-19 DIAGNOSIS — E7800 Pure hypercholesterolemia, unspecified: Secondary | ICD-10-CM | POA: Diagnosis not present

## 2018-01-20 DIAGNOSIS — K7689 Other specified diseases of liver: Secondary | ICD-10-CM | POA: Diagnosis not present

## 2018-01-24 ENCOUNTER — Ambulatory Visit: Payer: 59

## 2018-01-24 DIAGNOSIS — M542 Cervicalgia: Secondary | ICD-10-CM | POA: Diagnosis not present

## 2018-01-24 DIAGNOSIS — M2560 Stiffness of unspecified joint, not elsewhere classified: Secondary | ICD-10-CM | POA: Diagnosis not present

## 2018-01-24 DIAGNOSIS — R293 Abnormal posture: Secondary | ICD-10-CM | POA: Diagnosis not present

## 2018-01-26 DIAGNOSIS — M2560 Stiffness of unspecified joint, not elsewhere classified: Secondary | ICD-10-CM | POA: Diagnosis not present

## 2018-01-26 DIAGNOSIS — R293 Abnormal posture: Secondary | ICD-10-CM | POA: Diagnosis not present

## 2018-01-26 DIAGNOSIS — M542 Cervicalgia: Secondary | ICD-10-CM | POA: Diagnosis not present

## 2018-01-27 DIAGNOSIS — R293 Abnormal posture: Secondary | ICD-10-CM | POA: Diagnosis not present

## 2018-01-27 DIAGNOSIS — M2560 Stiffness of unspecified joint, not elsewhere classified: Secondary | ICD-10-CM | POA: Diagnosis not present

## 2018-01-27 DIAGNOSIS — M542 Cervicalgia: Secondary | ICD-10-CM | POA: Diagnosis not present

## 2018-01-31 DIAGNOSIS — M542 Cervicalgia: Secondary | ICD-10-CM | POA: Diagnosis not present

## 2018-01-31 DIAGNOSIS — R293 Abnormal posture: Secondary | ICD-10-CM | POA: Diagnosis not present

## 2018-01-31 DIAGNOSIS — M2560 Stiffness of unspecified joint, not elsewhere classified: Secondary | ICD-10-CM | POA: Diagnosis not present

## 2018-02-02 DIAGNOSIS — M542 Cervicalgia: Secondary | ICD-10-CM | POA: Diagnosis not present

## 2018-02-02 DIAGNOSIS — M2560 Stiffness of unspecified joint, not elsewhere classified: Secondary | ICD-10-CM | POA: Diagnosis not present

## 2018-02-02 DIAGNOSIS — R293 Abnormal posture: Secondary | ICD-10-CM | POA: Diagnosis not present

## 2018-02-03 DIAGNOSIS — M542 Cervicalgia: Secondary | ICD-10-CM | POA: Diagnosis not present

## 2018-02-03 DIAGNOSIS — R293 Abnormal posture: Secondary | ICD-10-CM | POA: Diagnosis not present

## 2018-02-03 DIAGNOSIS — M2560 Stiffness of unspecified joint, not elsewhere classified: Secondary | ICD-10-CM | POA: Diagnosis not present

## 2018-02-07 DIAGNOSIS — R293 Abnormal posture: Secondary | ICD-10-CM | POA: Diagnosis not present

## 2018-02-07 DIAGNOSIS — M542 Cervicalgia: Secondary | ICD-10-CM | POA: Diagnosis not present

## 2018-02-07 DIAGNOSIS — M2560 Stiffness of unspecified joint, not elsewhere classified: Secondary | ICD-10-CM | POA: Diagnosis not present

## 2018-02-10 DIAGNOSIS — M2560 Stiffness of unspecified joint, not elsewhere classified: Secondary | ICD-10-CM | POA: Diagnosis not present

## 2018-02-10 DIAGNOSIS — M542 Cervicalgia: Secondary | ICD-10-CM | POA: Diagnosis not present

## 2018-02-10 DIAGNOSIS — R293 Abnormal posture: Secondary | ICD-10-CM | POA: Diagnosis not present

## 2018-02-14 DIAGNOSIS — M542 Cervicalgia: Secondary | ICD-10-CM | POA: Diagnosis not present

## 2018-02-14 DIAGNOSIS — M2560 Stiffness of unspecified joint, not elsewhere classified: Secondary | ICD-10-CM | POA: Diagnosis not present

## 2018-02-14 DIAGNOSIS — R293 Abnormal posture: Secondary | ICD-10-CM | POA: Diagnosis not present

## 2018-02-15 ENCOUNTER — Ambulatory Visit (INDEPENDENT_AMBULATORY_CARE_PROVIDER_SITE_OTHER): Payer: 59 | Admitting: Pharmacist

## 2018-02-15 VITALS — BP 120/82 | HR 78

## 2018-02-15 DIAGNOSIS — I10 Essential (primary) hypertension: Secondary | ICD-10-CM | POA: Diagnosis not present

## 2018-02-15 NOTE — Progress Notes (Signed)
Patient ID: Sheri Edwards                 DOB: 01-16-1956                      MRN: [id number]     HPI: Sheri Edwards is a 62 y.o. female patient of Dr. Eldridge DaceVaranasi who presents today for hypertension follow up. PMH significant for HTN. She was referred for to cardiology for chest pain work up. At her most recent visit in HTN clinic her lisinopril was increased to 40mg  daily. She was changed to losartan 100mg  daily by her primary care provider about 1 month ago.   She presents today for follow up. She states she has not had any dizziness since our last visit. She states that she does feel her heart beat increase when she works out. She believes that the losartan works better for her than the lisinopril, but pressures are still erattic. She states her pressure are up when she is running around and active. She tried taking 50mg  BID of losartan and kept forgetting evening dose so she went back to 100mg  once daily. She does have a significant amount of back pain currently as well.   Current HTN meds:  Losartan 100mg  daily in the morning  Previously tried: none  BP goal: <130/80  Family History: mother 58(88) - HTH, died after fall and head injury, father 57(36) - kidney failure/diabetes, 1 sister with heart issues  Social History: No tobacco, quit several times, last cigarette about 10 years ago; 1-2 glasses of wine per night; coffee in am, diet soda and tea during days  Diet: Sugars running high 100-120 on physicals; mostly home cooked meals, admits to excessive salt intake; trying to avoid adding salt at table; some fruits/vegetables, salads, eats a lot of Mediterranean foods; more berries than fruits  Exercise:  None recently, some stretching with [employer] on [employer]  Home BP readings:  153/94 - 115/72 - it varies throughout the day.   Wt Readings from Last 3 Encounters:  11/02/17 169 lb 12.8 oz (77 kg)   BP Readings from Last 3 Encounters:  02/15/18 120/82  12/27/17 136/88  11/17/17 (!)  158/84   Pulse Readings from Last 3 Encounters:  02/15/18 78  12/27/17 77  11/17/17 80    Renal function: CrCl cannot be calculated (Patient's most recent lab result is older than the maximum 21 days allowed.).  Past Medical History:  Diagnosis Date  . Acute left-sided low back pain without sciatica   . Bell's palsy   . Fatty liver    mild  . Glaucoma   . Hip pain, left   . Hyperlipidemia   . Hypertension   . Hypothyroidism   . Migraines   . Vitamin D deficiency     Current Outpatient Medications on File Prior to Visit  Medication Sig Dispense Refill  . Blood Pressure Monitoring (BLOOD PRESSURE MONITOR AUTOMAT) DEVI Check home blood pressures daily 1 Device 0  . levothyroxine (SYNTHROID) 112 MCG tablet Take 112 mcg by mouth daily before breakfast.    . losartan (COZAAR) 100 MG tablet Take 100 mg by mouth daily.    . Multiple Vitamins-Minerals (MULTIVITAMIN ADULTS PO) Take 1 capsule by mouth daily.    . Omega-3 1000 MG CAPS Take 1 capsule by mouth daily.    . Vitamin D, Ergocalciferol, (DRISDOL) 50000 units CAPS capsule Take 1 capsule by mouth every 30 (thirty) days.     No  current facility-administered medications on file prior to visit.     Allergies  Allergen Reactions  . Aspirin Swelling  . Barbiturates Hives  . Valium [Diazepam] Hives  . Flagyl [Metronidazole] Other (See Comments)    Pt unsure about reactions to this medication.  Marland Kitchen Penicillins Other (See Comments)    Pt unsure about reactions  . Codeine Sulfate Other (See Comments)    Pt states "the lights turn yellow and  feels not normal". It also makes pt feel sick.  . Paxil [Paroxetine] Other (See Comments)    Pt states "it makes me have thoughts of killing myself"    Blood pressure 120/82, pulse 78, SpO2 96 %.   Assessment/Plan: Hypertension: BP is at goal today. Will continue management as per primary physician. I did review how to properly take her blood pressure as I think some of the elevated  measurements are due to physical activity or caffeine right before measurement.  Advised she keep follow up appt with primary care physician for lab work and repeat BMET in a few weeks. Follow up with Dr. Eldridge Dace as recommended - she states he was supposed to do an ECHO for follow up though I do not see that mentioned in his note. Will forward message to him to address if needed.    Thank you, Freddie Apley. Cleatis Polka, PharmD  [facility]  02/16/2018 3:14 PM

## 2018-02-15 NOTE — Patient Instructions (Signed)
Your blood pressure goal is less than 130/80   Check your blood pressure at home daily (if able) and keep record of the readings.  Take your BP meds as follows: CONTINUE as prescribed  Exercise as you're able, try to walk approximately 30 minutes per day.  Keep salt intake to a minimum, especially watch canned and prepared boxed foods.  Eat more fresh fruits and vegetables and fewer canned items.  Avoid eating in fast food restaurants.    HOW TO TAKE YOUR BLOOD PRESSURE: . Rest 5 minutes before taking your blood pressure. .  Don't smoke or drink caffeinated beverages for at least 30 minutes before. . Take your blood pressure before (not after) you eat. . Sit comfortably with your back supported and both feet on the floor (don't cross your legs). . Elevate your arm to heart level on a table or a desk. . Use the proper sized cuff. It should fit smoothly and snugly around your bare upper arm. There should be enough room to slip a fingertip under the cuff. The bottom edge of the cuff should be 1 inch above the crease of the elbow. . Ideally, take 3 measurements at one sitting and record the average.

## 2018-02-16 ENCOUNTER — Encounter: Payer: Self-pay | Admitting: Pharmacist

## 2018-02-25 ENCOUNTER — Telehealth: Payer: Self-pay

## 2018-02-25 NOTE — Telephone Encounter (Signed)
Left message for patient to call back  

## 2018-02-25 NOTE — Telephone Encounter (Signed)
-----   Message from Corky CraftsJayadeep S Varanasi, MD sent at 02/25/2018 12:50 PM EDT ----- We were going to schedule ETT when her BP was controlled.   JV ----- Message ----- From: Levin BaconAuten, Kelley M, [facility] Sent: 02/16/2018   3:21 PM To: Corky CraftsJayadeep S Varanasi, MD  Dr. Eldridge DaceVaranasi, Ms. Hamm was under the impression that she needed to have an ECHO done after her blood pressure is controlled. I do not see that ordered in our system so I told her I would ask. Her pressures have been fairly well controlled over the last month.  Thank you, Nicholaus BloomKelley

## 2018-03-04 NOTE — Telephone Encounter (Signed)
Left message for patient to call back  

## 2018-03-09 NOTE — Telephone Encounter (Signed)
Left message for patient to call back  

## 2018-03-10 NOTE — Telephone Encounter (Signed)
Left message for patient to call back  

## 2018-03-15 DIAGNOSIS — R300 Dysuria: Secondary | ICD-10-CM | POA: Diagnosis not present

## 2018-03-15 DIAGNOSIS — N390 Urinary tract infection, site not specified: Secondary | ICD-10-CM | POA: Diagnosis not present

## 2018-03-16 DIAGNOSIS — E039 Hypothyroidism, unspecified: Secondary | ICD-10-CM | POA: Diagnosis not present

## 2018-03-16 DIAGNOSIS — I10 Essential (primary) hypertension: Secondary | ICD-10-CM | POA: Diagnosis not present

## 2018-03-16 DIAGNOSIS — R945 Abnormal results of liver function studies: Secondary | ICD-10-CM | POA: Diagnosis not present

## 2018-03-16 DIAGNOSIS — R739 Hyperglycemia, unspecified: Secondary | ICD-10-CM | POA: Diagnosis not present

## 2018-03-16 IMAGING — MG 2D DIGITAL DIAGNOSTIC UNILATERAL LEFT MAMMOGRAM WITH CAD AND ADJ
6 series · 6 of 14 positions shown · non-contrast
Comparison: January 15, 2017 and earlier priors dating back to November 12, 2005

CLINICAL DATA: Possible architectural distortion identified in the
upper outer quadrant of the left breast is screening mammogram.
Patient is asymptomatic.

EXAM:
2D DIGITAL DIAGNOSTIC RIGHT MAMMOGRAM WITH CAD AND ADJUNCT TOMO
ULTRASOUND RIGHT BREAST

[L MLO]
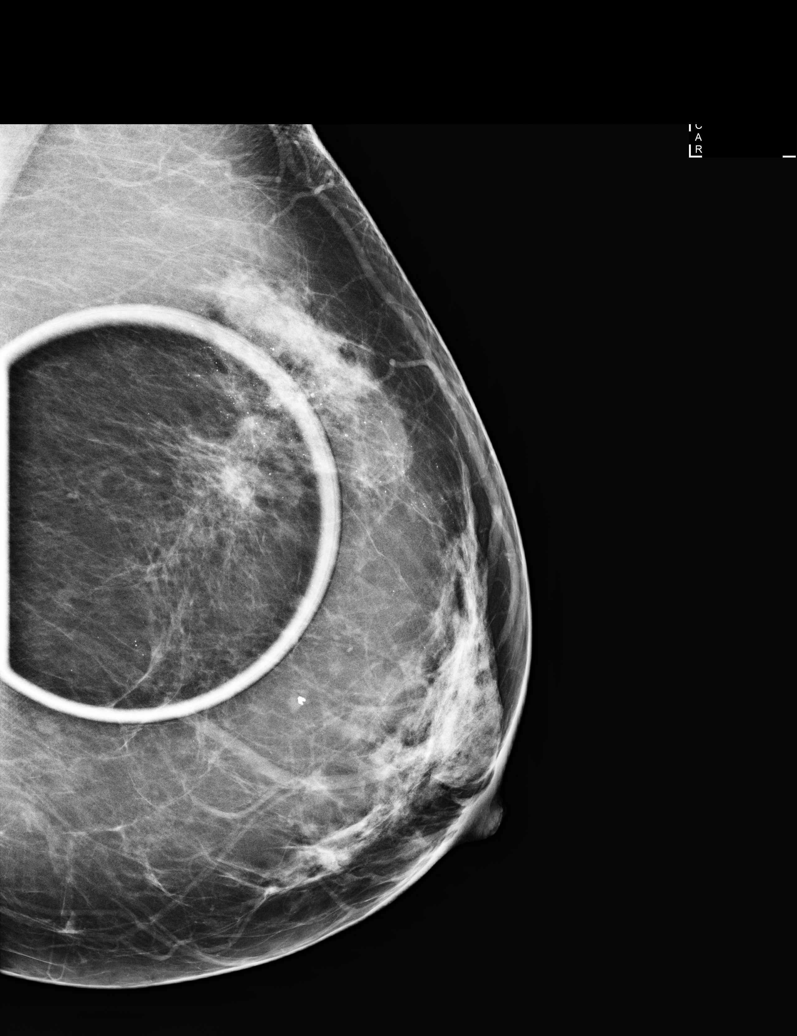

[L CC]
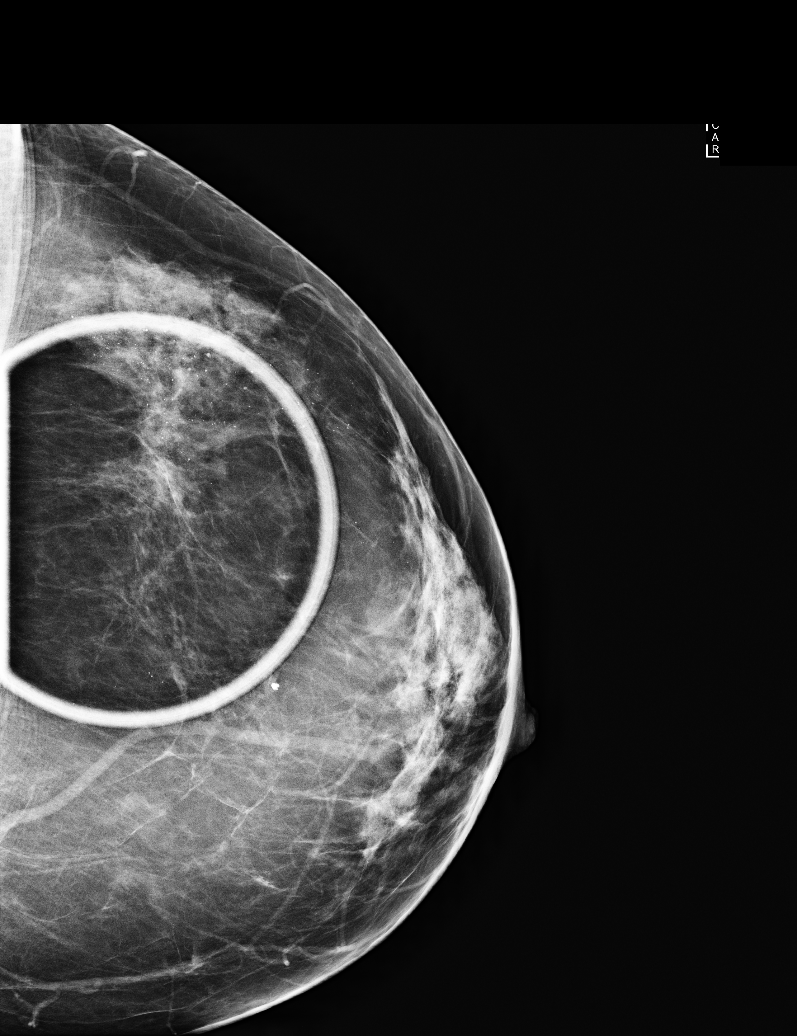

[L MLO synth-2D]
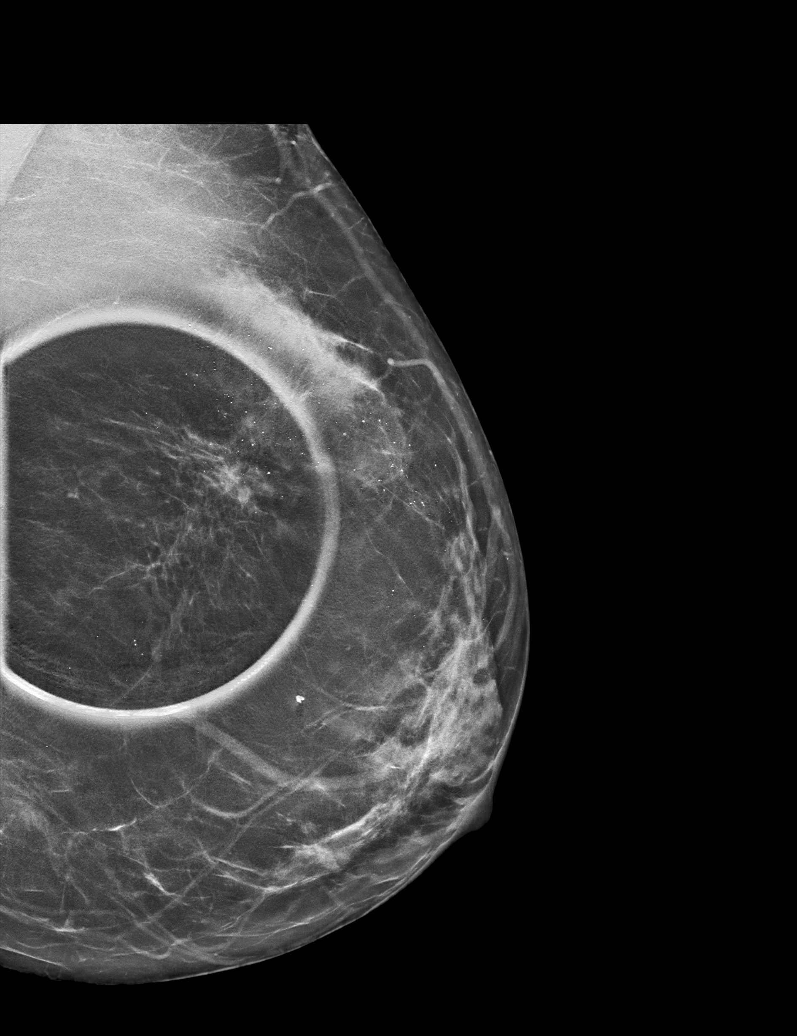

[L CC synth-2D]
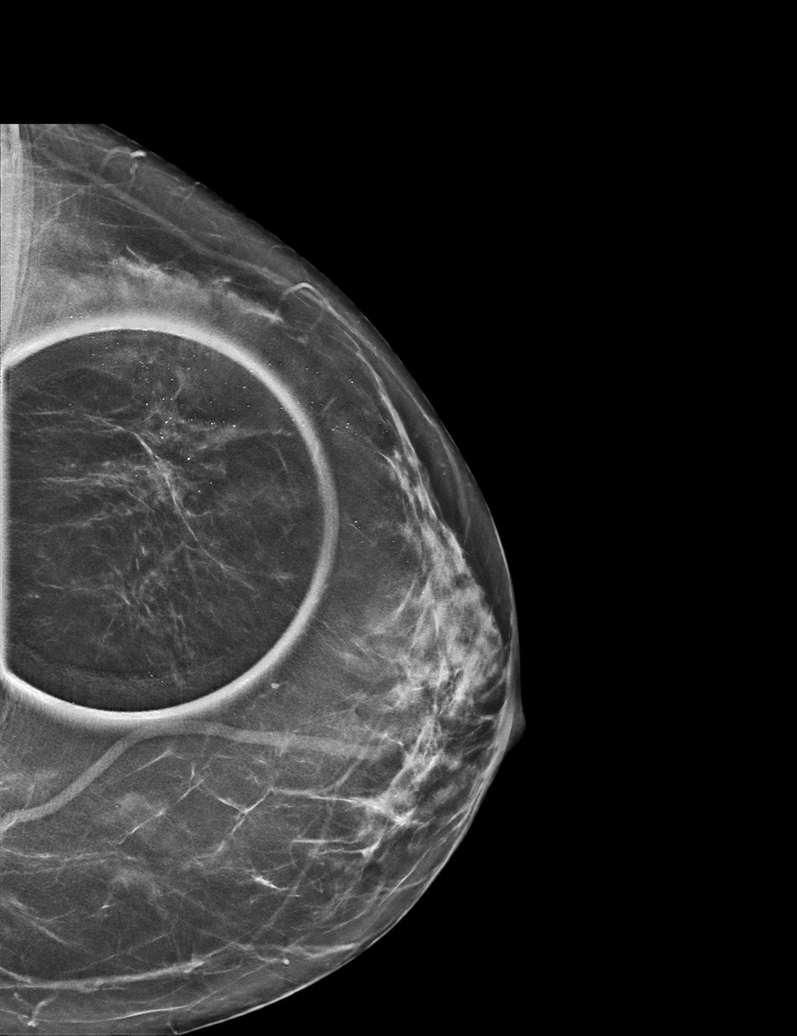

[L MLO tomo · tomo slice 33/64.0]
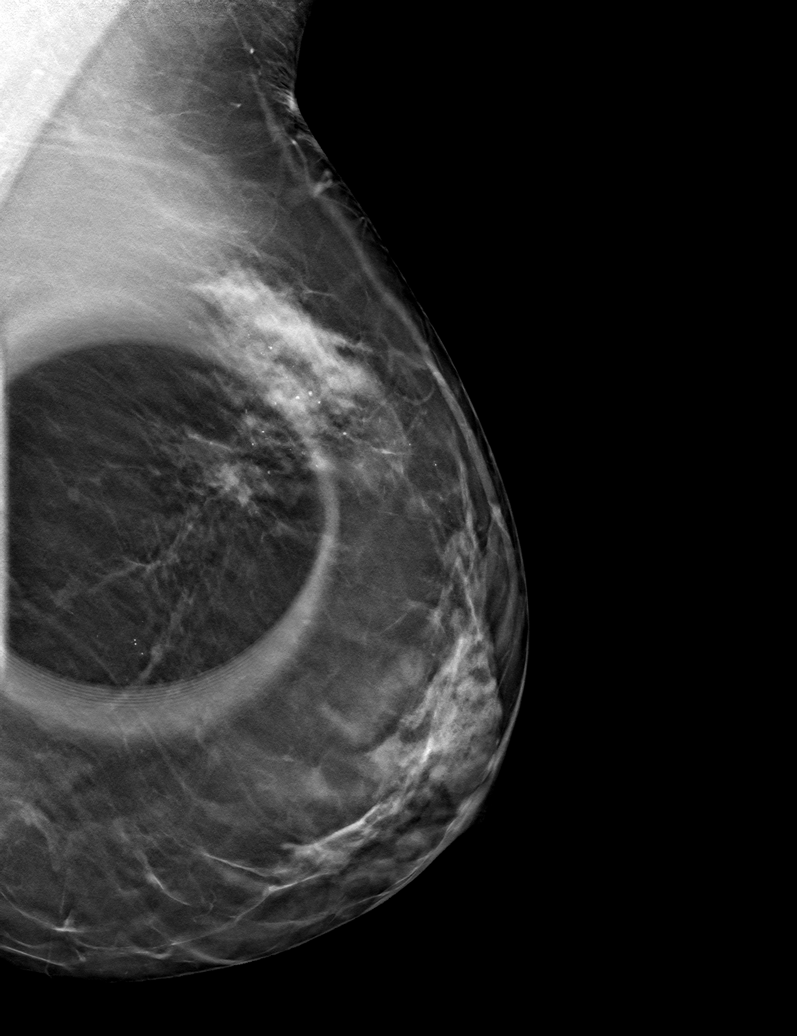

[L CC tomo · tomo slice 31/60.0]
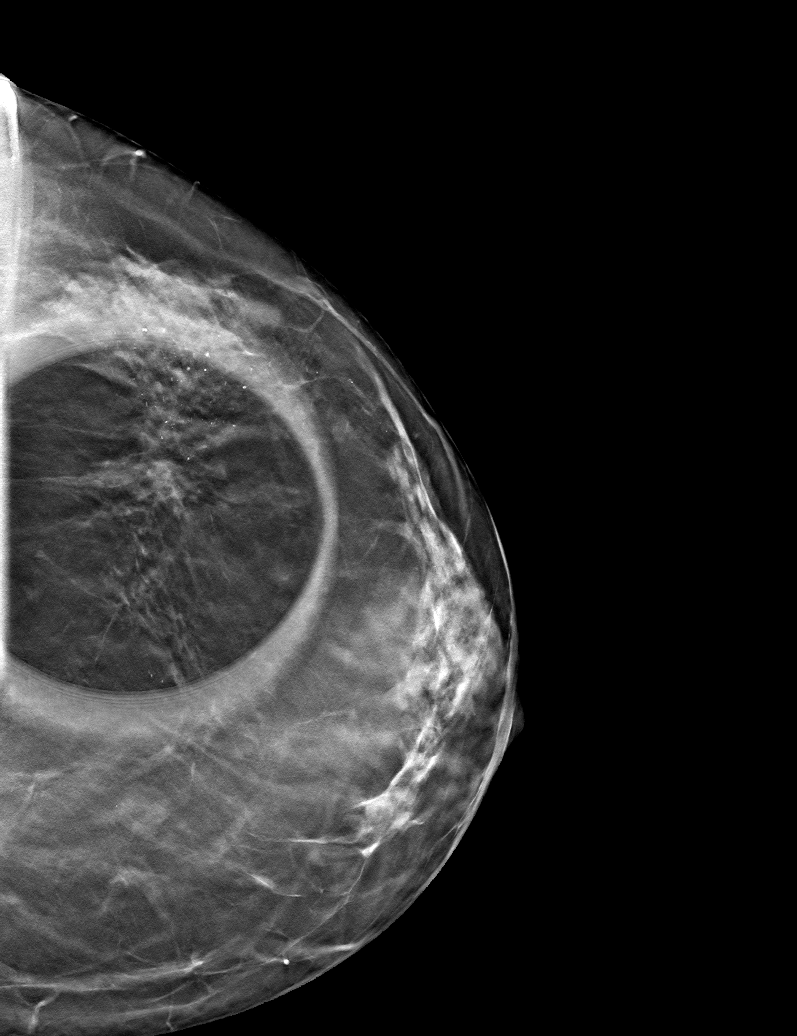

[6 of 14 positions shown; findings below may reference images not displayed]

ACR Breast Density Category c: The breast tissue is heterogeneously
dense, which may obscure small masses.
FINDINGS: Focal spot compression tomography views of the upper-outer quadrant
of the left breast show heterogeneously dense breast parenchyma with
a focal area of increased density, possibly reflecting an irregular
mass.

Mammographic images were processed with CAD.

On physical exam, no mass is palpated in the upper-outer quadrant of
the left breast.

Targeted ultrasound is performed, showing an irregular hypoechoic
level mass at [DATE] position 7 cm from the nipple measuring 1.0 x
x 0.7 cm. There is no definite internal vascular flow, but there is
some associated posterior acoustic shadowing. 0.5 cm directly medial
to this is a non-shadowing 0.8 x 0.7 x 0.4 cm focal focal
hypoechogenic area that could reflect dense fibroglandular tissue or
a small mass. In total, the 2 areas measure 2.1 cm greatest
diameter.

Ultrasound of the left axilla is negative for lymphadenopathy.
IMPRESSION: 1. 1.0 cm hypoechoic irregular mass in the [DATE] position of the left
breast 7 cm from the nipple. Malignancy cannot be excluded. Focal
fibrosis is also a consideration. 5 mm directly medial is a smaller
left suspicious mass versus island of dense glandular tissue.

RECOMMENDATION:
Ultrasound-guided core needle biopsy of the mass at [DATE] position in
the left breast 7 cm from the nipple is recommended. The patient is
being scheduled for ultrasound guided biopsy at her convenience.

I have discussed the findings and recommendations with the patient.
Results were also provided in writing at the conclusion of the
visit. If applicable, a reminder letter will be sent to the patient
regarding the next appointment.

BI-RADS CATEGORY  4: Suspicious.

## 2018-03-17 IMAGING — MG MM CLIP PLACEMENT
2 series · 2 of 2 positions shown · non-contrast
Comparison: Previous exam(s).

CLINICAL DATA: Evaluate clip placement

EXAM:
DIAGNOSTIC LEFT MAMMOGRAM POST ULTRASOUND BIOPSY

[L CC]
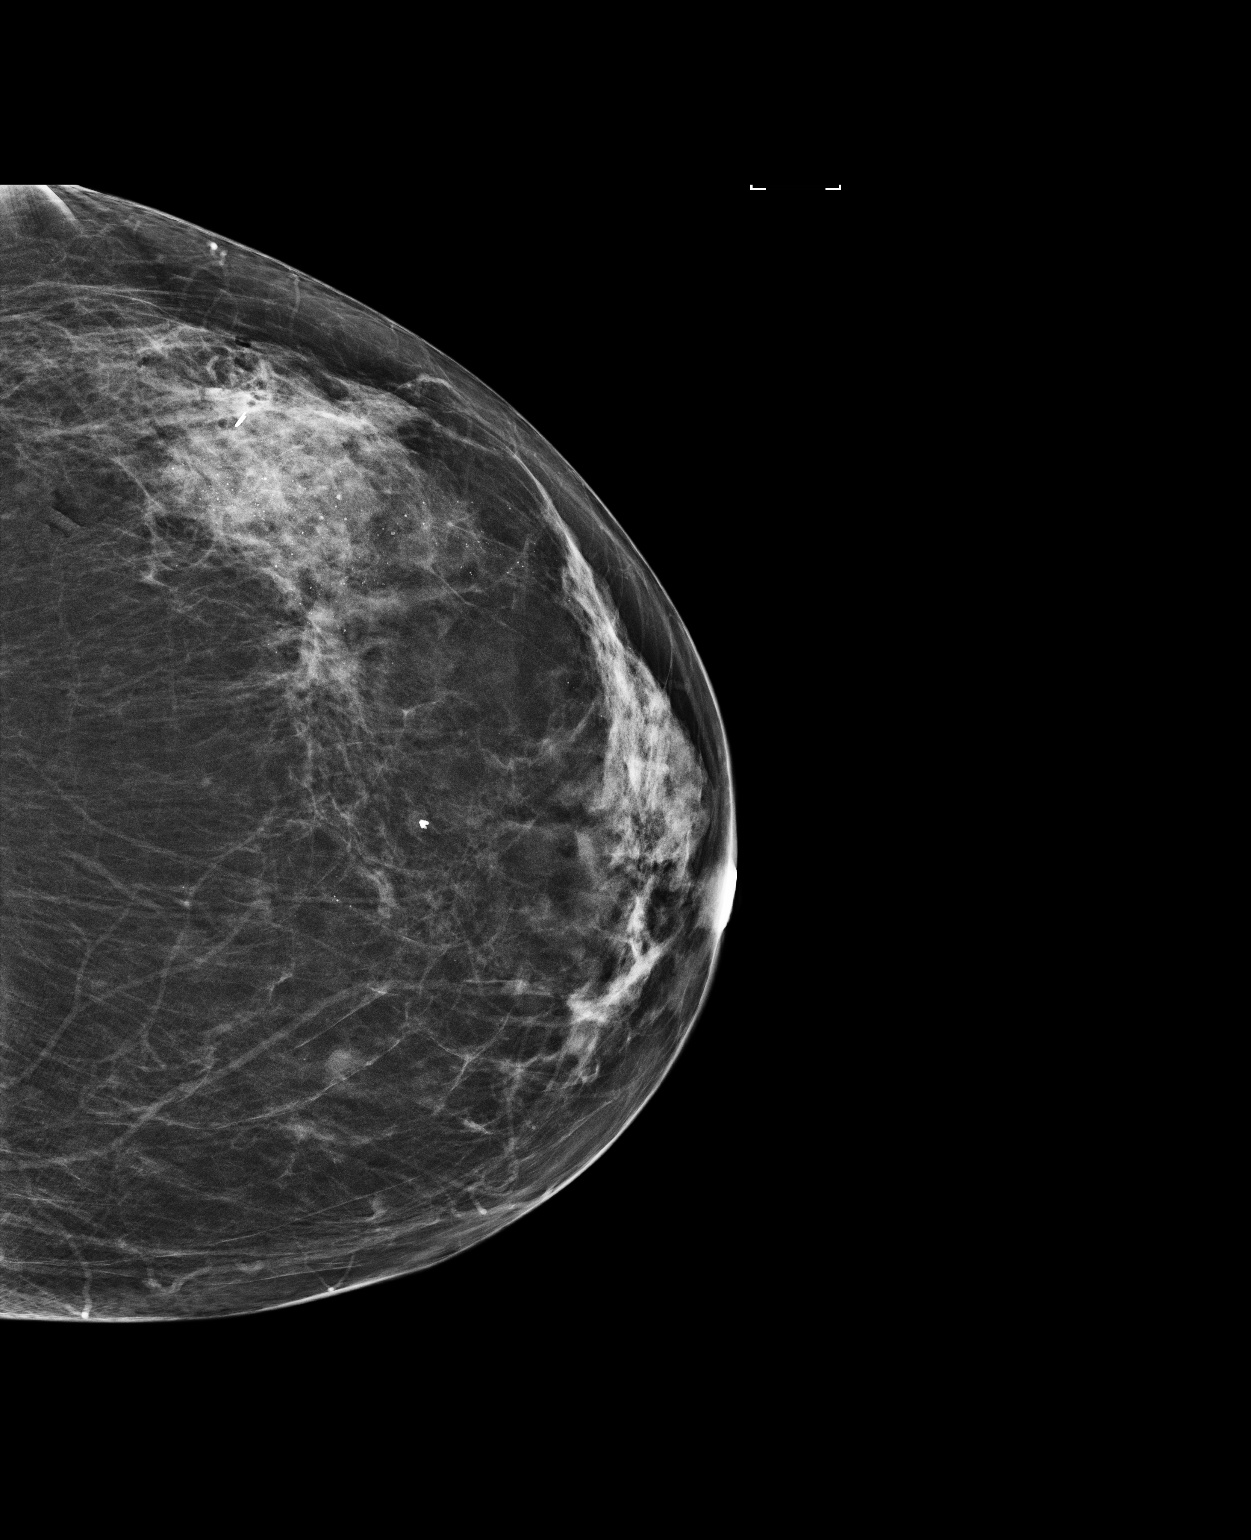

[L ML]
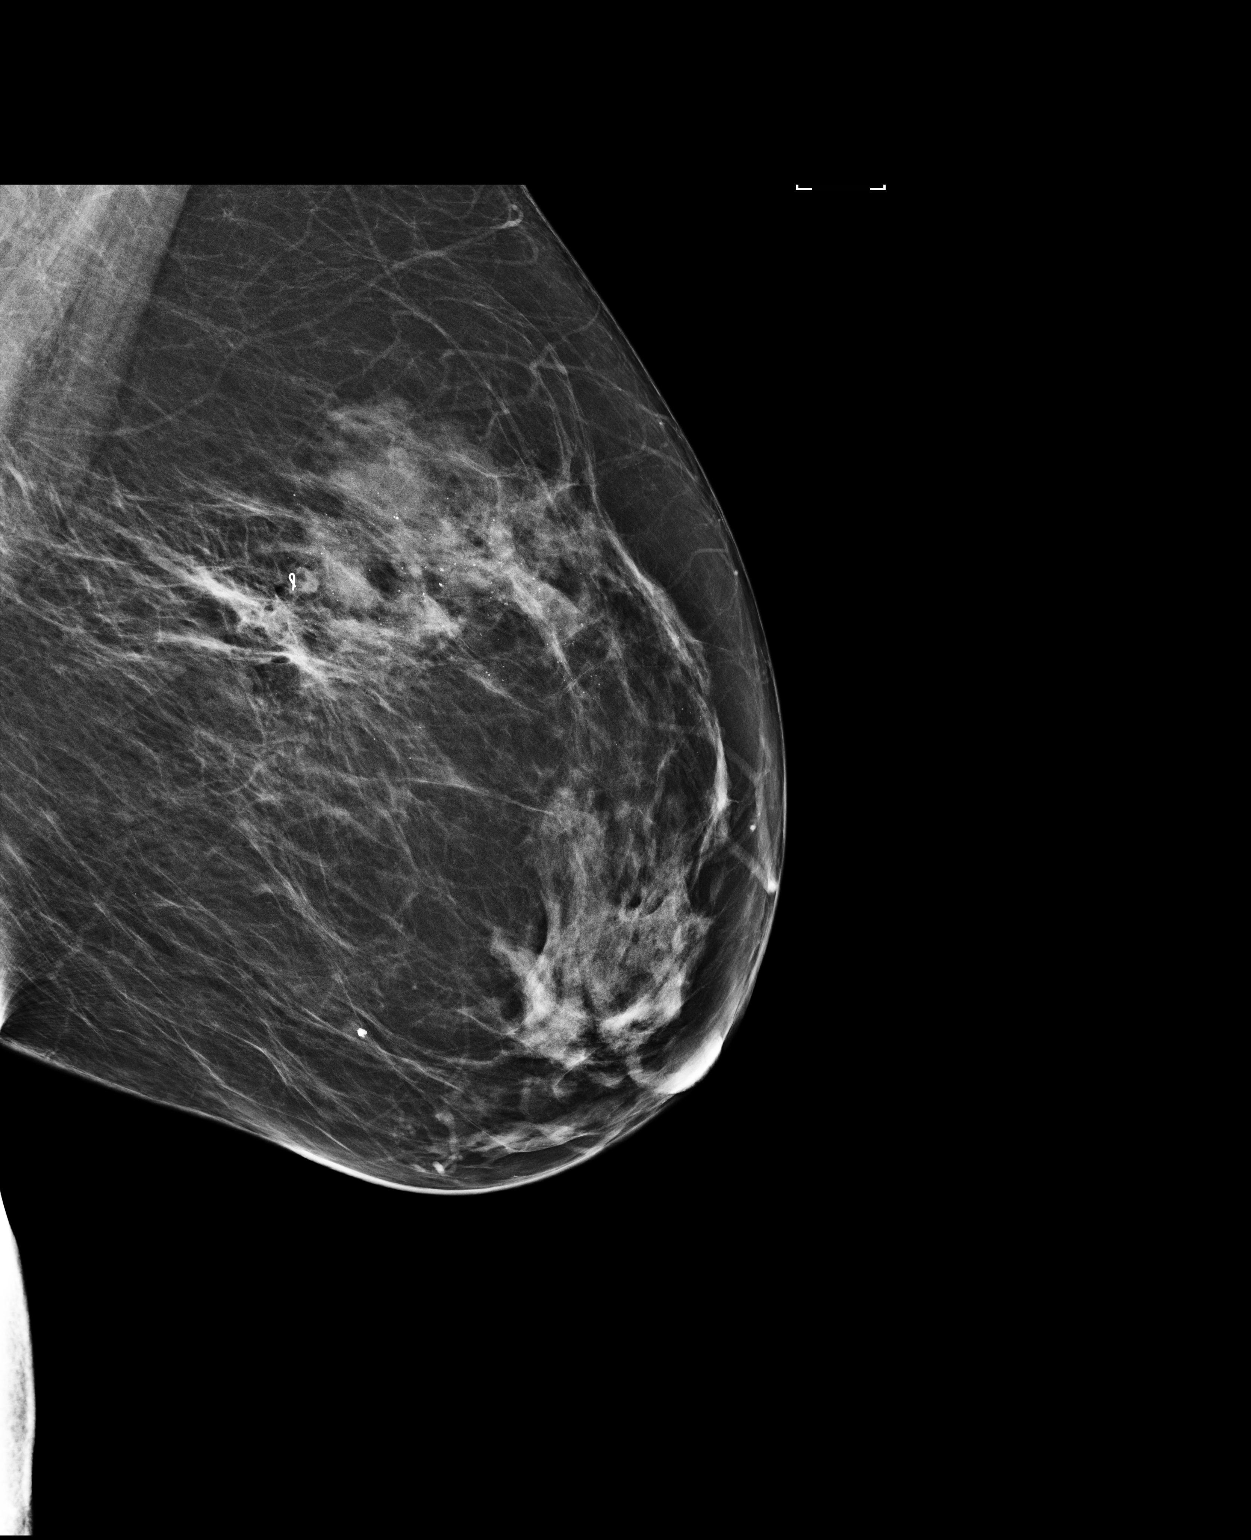

[2 of 2 positions shown; findings below may reference images not displayed]

FINDINGS: Mammographic images were obtained following ultrasound guided biopsy
of an irregular mass in the left breast. The ribbon shaped biopsy
clip is 4.2 cm lateral and slightly posterior to the mammographic
finding.
IMPRESSION: The ribbon shaped clip is 4.2 cm lateral to the mammographic
finding. The sonographic finding which was biopsied does not
correlate with the mammographic finding. Review of the case
demonstrates little change mammographically since 0110 and I suspect
the biopsy will demonstrate focal fibrosis. In the setting of focal
fibrosis, recommend a six-month follow-up mammogram and ultrasound.
If the pathology results in a complex sclerosing lesion, further
biopsies may be necessary. If the biopsy demonstrates malignancy,
recommend breast MRI. In the setting of malignancy, the patient
would likely require additional biopsies.

Final Assessment: Post Procedure Mammograms for Marker Placement

## 2018-03-22 NOTE — Telephone Encounter (Signed)
Left message for patient to call back.    Letter sent.

## 2018-03-23 ENCOUNTER — Telehealth: Payer: Self-pay | Admitting: Interventional Cardiology

## 2018-03-23 DIAGNOSIS — E039 Hypothyroidism, unspecified: Secondary | ICD-10-CM | POA: Diagnosis not present

## 2018-03-23 DIAGNOSIS — R072 Precordial pain: Secondary | ICD-10-CM

## 2018-03-23 DIAGNOSIS — I10 Essential (primary) hypertension: Secondary | ICD-10-CM | POA: Diagnosis not present

## 2018-03-23 DIAGNOSIS — R739 Hyperglycemia, unspecified: Secondary | ICD-10-CM | POA: Diagnosis not present

## 2018-03-23 NOTE — Telephone Encounter (Signed)
New Message    Pt returning call for [place], states her cell is a good number to reach and that she did not answer previous calls do to not knowing the number. Please call

## 2018-03-23 NOTE — Telephone Encounter (Signed)
Returned call to patient and made her aware that Dr. Eldridge DaceVaranasi wanted her to have ETT once her BP was controlled. ETT ordered and scheduled for 8/22 at 2:00 PM. Patient verbalized understanding and thanked me for the call.

## 2018-03-31 ENCOUNTER — Ambulatory Visit (INDEPENDENT_AMBULATORY_CARE_PROVIDER_SITE_OTHER): Payer: 59

## 2018-03-31 DIAGNOSIS — R072 Precordial pain: Secondary | ICD-10-CM | POA: Diagnosis not present

## 2018-03-31 LAB — EXERCISE TOLERANCE TEST
Estimated workload: 7 METS
Exercise duration (min): 5 min
Exercise duration (sec): 0 s
MPHR: 158 {beats}/min
Peak HR: 157 {beats}/min
Percent HR: 99 %
RPE: 17
Rest HR: 80 {beats}/min

## 2018-04-06 ENCOUNTER — Telehealth: Payer: Self-pay | Admitting: Cardiovascular Disease

## 2018-04-06 NOTE — Telephone Encounter (Signed)
New Message        Patient returned  Your call for results

## 2018-04-06 NOTE — Telephone Encounter (Signed)
Informed patient of ETT results. 

## 2018-05-03 ENCOUNTER — Other Ambulatory Visit: Payer: Self-pay | Admitting: Internal Medicine

## 2018-05-03 DIAGNOSIS — Z1231 Encounter for screening mammogram for malignant neoplasm of breast: Secondary | ICD-10-CM

## 2018-06-03 ENCOUNTER — Ambulatory Visit
Admission: RE | Admit: 2018-06-03 | Discharge: 2018-06-03 | Disposition: A | Discharge status: Home / Self Care | Payer: 59 | Source: Ambulatory Visit

## 2018-06-03 DIAGNOSIS — Z1231 Encounter for screening mammogram for malignant neoplasm of breast: Secondary | ICD-10-CM

## 2018-06-07 ENCOUNTER — Other Ambulatory Visit: Payer: Self-pay | Admitting: Internal Medicine

## 2018-06-07 DIAGNOSIS — R928 Other abnormal and inconclusive findings on diagnostic imaging of breast: Secondary | ICD-10-CM

## 2018-06-13 ENCOUNTER — Ambulatory Visit
Admission: RE | Admit: 2018-06-13 | Discharge: 2018-06-13 | Disposition: A | Discharge status: Home / Self Care | Payer: 59 | Source: Ambulatory Visit | Attending: Internal Medicine | Admitting: Internal Medicine

## 2018-06-13 ENCOUNTER — Other Ambulatory Visit: Payer: Self-pay | Admitting: Internal Medicine

## 2018-06-13 ENCOUNTER — Ambulatory Visit: Payer: 59

## 2018-06-13 DIAGNOSIS — R928 Other abnormal and inconclusive findings on diagnostic imaging of breast: Secondary | ICD-10-CM

## 2018-06-20 ENCOUNTER — Ambulatory Visit
Admission: RE | Admit: 2018-06-20 | Discharge: 2018-06-20 | Disposition: A | Discharge status: Home / Self Care | Payer: 59 | Source: Ambulatory Visit | Attending: Internal Medicine | Admitting: Internal Medicine

## 2018-06-20 ENCOUNTER — Other Ambulatory Visit: Payer: Self-pay | Admitting: Internal Medicine

## 2018-06-20 DIAGNOSIS — R928 Other abnormal and inconclusive findings on diagnostic imaging of breast: Secondary | ICD-10-CM

## 2018-06-20 DIAGNOSIS — N6320 Unspecified lump in the left breast, unspecified quadrant: Secondary | ICD-10-CM

## 2019-07-19 ENCOUNTER — Other Ambulatory Visit: Payer: Self-pay

## 2019-07-19 ENCOUNTER — Other Ambulatory Visit: Payer: Self-pay | Admitting: Internal Medicine

## 2019-07-19 ENCOUNTER — Ambulatory Visit
Admission: RE | Admit: 2019-07-19 | Discharge: 2019-07-19 | Disposition: A | Discharge status: Home / Self Care | Payer: 59 | Source: Ambulatory Visit | Attending: Internal Medicine | Admitting: Internal Medicine

## 2019-07-19 DIAGNOSIS — Z1231 Encounter for screening mammogram for malignant neoplasm of breast: Secondary | ICD-10-CM

## 2019-10-13 ENCOUNTER — Ambulatory Visit: Payer: 59 | Attending: Internal Medicine

## 2019-10-13 DIAGNOSIS — Z23 Encounter for immunization: Secondary | ICD-10-CM | POA: Insufficient documentation

## 2019-10-13 NOTE — Progress Notes (Signed)
   Covid-19 Vaccination Clinic  Name:  Sheri Edwards    MRN: [id number] DOB: 01/11/1956  10/13/2019  Sheri Edwards was observed post Covid-19 immunization for 30 minutes based on pre-vaccination screening without incident. She was provided with Vaccine Information Sheet and instruction to access the V-Safe system.   Sheri Edwards was instructed to call 911 with any severe reactions post vaccine: Marland Kitchen Difficulty breathing  . Swelling of face and throat  . A fast heartbeat  . A bad rash all over body  . Dizziness and weakness

## 2019-11-08 ENCOUNTER — Ambulatory Visit: Payer: 59

## 2019-11-14 ENCOUNTER — Ambulatory Visit: Payer: 59 | Attending: Internal Medicine

## 2019-11-14 DIAGNOSIS — Z23 Encounter for immunization: Secondary | ICD-10-CM

## 2019-11-14 NOTE — Progress Notes (Signed)
   Covid-19 Vaccination Clinic  Name:  Sheri Edwards    MRN: [id number] DOB: December 05, 1955  11/14/2019  [name] was observed post Covid-19 immunization for 30 minutes based on pre-vaccination screening without incident. She was provided with Vaccine Information Sheet and instruction to access the V-Safe system.   [name] was instructed to call 911 with any severe reactions post vaccine: Marland Kitchen Difficulty breathing  . Swelling of face and throat  . A fast heartbeat  . A bad rash all over body  . Dizziness and weakness   Immunizations Administered    Name Date Dose VIS Date Route   Pfizer COVID-19 Vaccine 11/14/2019  1:42 PM 0.3 mL 07/21/2019 Intramuscular   Manufacturer: [employer], [employer]   Lot: [id number]   NDC: [address]-1

## 2020-07-20 ENCOUNTER — Other Ambulatory Visit: Payer: 59

## 2020-07-20 DIAGNOSIS — Z20822 Contact with and (suspected) exposure to covid-19: Secondary | ICD-10-CM

## 2020-07-22 LAB — SARS-COV-2, NAA 2 DAY TAT

## 2020-07-22 LAB — NOVEL CORONAVIRUS, NAA: SARS-CoV-2, NAA: NOT DETECTED

## 2020-12-02 ENCOUNTER — Other Ambulatory Visit: Payer: Self-pay | Admitting: Internal Medicine

## 2020-12-02 DIAGNOSIS — Z1231 Encounter for screening mammogram for malignant neoplasm of breast: Secondary | ICD-10-CM

## 2021-01-22 ENCOUNTER — Other Ambulatory Visit: Payer: Self-pay

## 2021-01-22 ENCOUNTER — Ambulatory Visit
Admission: RE | Admit: 2021-01-22 | Discharge: 2021-01-22 | Disposition: A | Discharge status: Home / Self Care | Payer: 59 | Source: Ambulatory Visit | Attending: Internal Medicine | Admitting: Internal Medicine

## 2021-01-22 DIAGNOSIS — Z1231 Encounter for screening mammogram for malignant neoplasm of breast: Secondary | ICD-10-CM

## 2021-12-18 ENCOUNTER — Other Ambulatory Visit: Payer: Self-pay | Admitting: Family Medicine

## 2021-12-18 DIAGNOSIS — Z1231 Encounter for screening mammogram for malignant neoplasm of breast: Secondary | ICD-10-CM

## 2022-01-23 ENCOUNTER — Ambulatory Visit
Admission: RE | Admit: 2022-01-23 | Discharge: 2022-01-23 | Disposition: A | Discharge status: Home / Self Care | Payer: 59 | Source: Ambulatory Visit | Attending: Family Medicine | Admitting: Family Medicine

## 2022-01-23 DIAGNOSIS — Z1231 Encounter for screening mammogram for malignant neoplasm of breast: Secondary | ICD-10-CM

## 2023-02-10 ENCOUNTER — Other Ambulatory Visit: Payer: Self-pay | Admitting: Registered Nurse

## 2023-02-10 ENCOUNTER — Other Ambulatory Visit: Payer: Self-pay | Admitting: Family Medicine

## 2023-02-10 ENCOUNTER — Ambulatory Visit
Admission: RE | Admit: 2023-02-10 | Discharge: 2023-02-10 | Disposition: A | Discharge status: Home / Self Care | Payer: 59 | Source: Ambulatory Visit

## 2023-02-10 DIAGNOSIS — Z1231 Encounter for screening mammogram for malignant neoplasm of breast: Secondary | ICD-10-CM

## 2023-06-24 ENCOUNTER — Other Ambulatory Visit: Payer: Self-pay

## 2023-06-24 ENCOUNTER — Emergency Department (HOSPITAL_BASED_OUTPATIENT_CLINIC_OR_DEPARTMENT_OTHER)
Admission: EM | Admit: 2023-06-24 | Discharge: 2023-06-24 | Disposition: A | Discharge status: Home / Self Care | Payer: 59 | Attending: Emergency Medicine | Admitting: Emergency Medicine

## 2023-06-24 ENCOUNTER — Encounter (HOSPITAL_BASED_OUTPATIENT_CLINIC_OR_DEPARTMENT_OTHER): Payer: Self-pay | Admitting: Emergency Medicine

## 2023-06-24 DIAGNOSIS — S90861A Insect bite (nonvenomous), right foot, initial encounter: Secondary | ICD-10-CM | POA: Insufficient documentation

## 2023-06-24 DIAGNOSIS — E039 Hypothyroidism, unspecified: Secondary | ICD-10-CM | POA: Diagnosis not present

## 2023-06-24 DIAGNOSIS — Z79899 Other long term (current) drug therapy: Secondary | ICD-10-CM | POA: Diagnosis not present

## 2023-06-24 DIAGNOSIS — I10 Essential (primary) hypertension: Secondary | ICD-10-CM | POA: Insufficient documentation

## 2023-06-24 DIAGNOSIS — W57XXXA Bitten or stung by nonvenomous insect and other nonvenomous arthropods, initial encounter: Secondary | ICD-10-CM | POA: Insufficient documentation

## 2023-06-24 MED ORDER — CEPHALEXIN 500 MG PO CAPS: 1000.0000 mg | ORAL_CAPSULE | Freq: Two times a day (BID) | ORAL | 0 refills | Status: AC

## 2023-06-24 MED ORDER — CEPHALEXIN 250 MG PO CAPS
1000.0000 mg | ORAL_CAPSULE | Freq: Once | ORAL | Status: AC
  Administered 2023-06-24: 1000 mg via ORAL
  Filled 2023-06-24: qty 4

## 2023-06-24 NOTE — ED Triage Notes (Signed)
Insect bite on Monday . Reight ankle. Looks better, but wants to be checked out

## 2023-06-24 NOTE — ED Provider Notes (Signed)
[facility] AT [facility] Provider Note   CSN: [id number] Arrival date & time: 06/24/23  1610     History  Chief Complaint  Patient presents with   Insect Bite   HPI Sheri Edwards is a 67 y.o. female with h/o HTN, hypothyroidism presenting for insect bite. Occurred a week ago. Reports multiple bites to the right ankle. States she was walking in the wood when it occurred. Reports that it was likely a spider. States swelling and redness have improved considerably but she wanted "to get it checked out to be safe". Denies fever and chills.  HPI     Home Medications Prior to Admission medications   Medication Sig Start Date End Date Taking? Authorizing Provider  cephALEXin (KEFLEX) 500 MG capsule Take 2 capsules (1,000 mg total) by mouth 2 (two) times daily. 06/24/23  Yes Gareth Eagle, PA-C  Blood Pressure Monitoring (BLOOD PRESSURE MONITOR AUTOMAT) DEVI Check home blood pressures daily 11/17/17   Corky Crafts, MD  levothyroxine (SYNTHROID) 112 MCG tablet Take 112 mcg by mouth daily before breakfast.    [provider]  losartan (COZAAR) 100 MG tablet Take 100 mg by mouth daily.    [provider]  Multiple Vitamins-Minerals (MULTIVITAMIN ADULTS PO) Take 1 capsule by mouth daily.    [provider]  Omega-3 1000 MG CAPS Take 1 capsule by mouth daily.    [provider]  Vitamin D, Ergocalciferol, (DRISDOL) 50000 units CAPS capsule Take 1 capsule by mouth every 30 (thirty) days.    [provider]      Allergies    Aspirin, Barbiturates, Valium [diazepam], Flagyl [metronidazole], Penicillins, Codeine sulfate, and Paxil [paroxetine]    Review of Systems   See HPI   Physical Exam Updated Vital Signs BP (!) 148/79 (BP Location: Right Arm)   Pulse 81   Temp 98.8 F (37.1 C)   Resp 16   SpO2 100%  Physical Exam Constitutional:      Appearance: Normal appearance.  HENT:     Head:  Normocephalic.     Nose: Nose normal.  Eyes:     Conjunctiva/sclera: Conjunctivae normal.  Pulmonary:     Effort: Pulmonary effort is normal.  Musculoskeletal:     Comments: 3 punctate lesions noted about the right foot and ankle. Not hot to touch. Noted mild surround edema and erythema. Not fluctuant.   Neurological:     Mental Status: She is alert.  Psychiatric:        Mood and Affect: Mood normal.     ED Results / Procedures / Treatments   Labs (all labs ordered are listed, but only abnormal results are displayed) Labs Reviewed - No data to display  EKG None  Radiology No results found.  Procedures Procedures    Medications Ordered in ED Medications  cephALEXin (KEFLEX) capsule 1,000 mg (has no administration in time range)    ED Course/ Medical Decision Making/ A&P                                 Medical Decision Making  67 yo well appearing female presenting for inset bite. Exam findings consistent with cellulitis but appears to be healing well. Started her on keflex and advised to follow up with PCP. Discussed return precautions. Vitals stable. Discharged.         Final Clinical Impression(s) / ED Diagnoses Final diagnoses:  Insect  bite of right foot, initial encounter    Rx / DC Orders ED Discharge Orders          Ordered    cephALEXin (KEFLEX) 500 MG capsule  2 times daily        06/24/23 1723              Shanethia, [place], PA-C 06/24/23 1723    Melene Plan, DO 06/24/23 1726

## 2023-06-24 NOTE — ED Notes (Signed)
Discharge instructions, follow up care, and prescription reviewed and explained, pt verbalized understanding and had no further questions on d/c. Pt caox4, ambulatory, NAD on d/c.  

## 2023-06-24 NOTE — Discharge Instructions (Signed)
Treating for cellulitis. Please follow up with PCP. Starting you on kelfex. If symtpoms worsen please return to the ED.

## 2023-09-23 ENCOUNTER — Encounter (HOSPITAL_BASED_OUTPATIENT_CLINIC_OR_DEPARTMENT_OTHER): Payer: Self-pay | Admitting: Emergency Medicine

## 2024-01-21 ENCOUNTER — Other Ambulatory Visit: Payer: Self-pay | Admitting: Registered Nurse

## 2024-01-21 DIAGNOSIS — Z1231 Encounter for screening mammogram for malignant neoplasm of breast: Secondary | ICD-10-CM

## 2024-02-14 ENCOUNTER — Ambulatory Visit

## 2024-02-14 ENCOUNTER — Other Ambulatory Visit: Payer: Self-pay | Admitting: Medical Genetics

## 2024-02-14 ENCOUNTER — Ambulatory Visit
Admission: RE | Admit: 2024-02-14 | Discharge: 2024-02-14 | Disposition: A | Discharge status: Home / Self Care | Source: Ambulatory Visit | Attending: Registered Nurse | Admitting: Registered Nurse

## 2024-02-14 DIAGNOSIS — Z1231 Encounter for screening mammogram for malignant neoplasm of breast: Secondary | ICD-10-CM

## 2024-03-01 ENCOUNTER — Other Ambulatory Visit

## 2024-03-01 DIAGNOSIS — Z006 Encounter for examination for normal comparison and control in clinical research program: Secondary | ICD-10-CM

## 2024-03-10 LAB — GENECONNECT MOLECULAR SCREEN: Genetic Analysis Overall Interpretation: NEGATIVE
